# Patient Record
Sex: Female | Born: 1945
Health system: Southern US, Community
[De-identification: ages and names within clinical notes are randomized; demographics above are authoritative.]

## PROBLEM LIST (undated history)

## (undated) DIAGNOSIS — G5760 Lesion of plantar nerve, unspecified lower limb: Secondary | ICD-10-CM

## (undated) HISTORY — DX: Lesion of plantar nerve, unspecified lower limb: G57.60

## (undated) HISTORY — PX: ABDOMINAL HYSTERECTOMY: SHX81

---

## 1999-02-07 ENCOUNTER — Other Ambulatory Visit: Admission: RE | Admit: 1999-02-07 | Discharge: 1999-02-07 | Payer: Self-pay | Admitting: Nurse Practitioner

## 2001-01-11 ENCOUNTER — Other Ambulatory Visit: Admission: RE | Admit: 2001-01-11 | Discharge: 2001-01-11 | Payer: Self-pay | Admitting: Obstetrics and Gynecology

## 2001-09-16 ENCOUNTER — Other Ambulatory Visit: Admission: RE | Admit: 2001-09-16 | Discharge: 2001-09-16 | Payer: Self-pay | Admitting: Internal Medicine

## 2001-09-21 ENCOUNTER — Encounter: Payer: Self-pay | Admitting: Internal Medicine

## 2001-09-21 ENCOUNTER — Ambulatory Visit (HOSPITAL_COMMUNITY): Admission: RE | Admit: 2001-09-21 | Discharge: 2001-09-21 | Payer: Self-pay | Admitting: Internal Medicine

## 2002-11-20 ENCOUNTER — Other Ambulatory Visit: Admission: RE | Admit: 2002-11-20 | Discharge: 2002-11-20 | Payer: Self-pay | Admitting: Obstetrics and Gynecology

## 2003-12-10 ENCOUNTER — Encounter: Payer: Self-pay | Admitting: Gastroenterology

## 2006-02-15 ENCOUNTER — Ambulatory Visit: Payer: Self-pay | Admitting: Internal Medicine

## 2007-01-07 ENCOUNTER — Ambulatory Visit: Payer: Self-pay | Admitting: Internal Medicine

## 2007-01-18 ENCOUNTER — Ambulatory Visit: Payer: Self-pay | Admitting: Internal Medicine

## 2007-03-08 ENCOUNTER — Ambulatory Visit: Payer: Self-pay | Admitting: Internal Medicine

## 2007-03-10 LAB — CONVERTED CEMR LAB
ALT: 78 units/L — ABNORMAL HIGH (ref 0–40)
AST: 44 units/L — ABNORMAL HIGH (ref 0–37)
Albumin: 3.5 g/dL (ref 3.5–5.2)
Alkaline Phosphatase: 74 units/L (ref 39–117)
BUN: 12 mg/dL (ref 6–23)
Basophils Absolute: 0 10*3/uL (ref 0.0–0.1)
Bilirubin Urine: NEGATIVE
Calcium: 9.1 mg/dL (ref 8.4–10.5)
Chloride: 102 meq/L (ref 96–112)
Creatinine, Ser: 0.7 mg/dL (ref 0.4–1.2)
Eosinophils Absolute: 0 10*3/uL (ref 0.0–0.6)
FSH: 152.8 milliintl units/mL
HCT: 41.2 % (ref 36.0–46.0)
Hemoglobin, Urine: NEGATIVE
Ketones, ur: NEGATIVE mg/dL
MCHC: 35.2 g/dL (ref 30.0–36.0)
MCV: 91.3 fL (ref 78.0–100.0)
Neutro Abs: 3.8 10*3/uL (ref 1.4–7.7)
Neutrophils Relative %: 53.1 % (ref 43.0–77.0)
Platelets: 258 10*3/uL (ref 150–400)
RBC: 4.51 M/uL (ref 3.87–5.11)
Specific Gravity, Urine: <=1.005 (ref 1.000–1.03)
Total Bilirubin: 0.8 mg/dL (ref 0.3–1.2)
Total Protein, Urine: NEGATIVE mg/dL
Urine Glucose: NEGATIVE mg/dL
Urobilinogen, UA: 0.2 (ref 0.0–1.0)
pH: 6.5 (ref 5.0–8.0)

## 2007-05-12 ENCOUNTER — Ambulatory Visit: Payer: Self-pay | Admitting: Internal Medicine

## 2007-05-17 ENCOUNTER — Ambulatory Visit: Payer: Self-pay | Admitting: Gastroenterology

## 2007-07-18 ENCOUNTER — Other Ambulatory Visit: Admission: RE | Admit: 2007-07-18 | Discharge: 2007-07-18 | Payer: Self-pay | Admitting: Obstetrics and Gynecology

## 2007-10-11 ENCOUNTER — Encounter: Payer: Self-pay | Admitting: Internal Medicine

## 2007-10-11 DIAGNOSIS — F329 Major depressive disorder, single episode, unspecified: Secondary | ICD-10-CM

## 2007-10-11 DIAGNOSIS — J309 Allergic rhinitis, unspecified: Secondary | ICD-10-CM

## 2007-11-15 ENCOUNTER — Ambulatory Visit: Payer: Self-pay | Admitting: Internal Medicine

## 2007-11-15 DIAGNOSIS — F988 Other specified behavioral and emotional disorders with onset usually occurring in childhood and adolescence: Secondary | ICD-10-CM | POA: Insufficient documentation

## 2007-11-15 DIAGNOSIS — R945 Abnormal results of liver function studies: Secondary | ICD-10-CM

## 2008-01-27 ENCOUNTER — Ambulatory Visit: Payer: Self-pay | Admitting: Internal Medicine

## 2008-01-27 DIAGNOSIS — R071 Chest pain on breathing: Secondary | ICD-10-CM | POA: Insufficient documentation

## 2008-01-30 LAB — CONVERTED CEMR LAB
ALT: 51 units/L — ABNORMAL HIGH (ref 0–35)
AST: 42 units/L — ABNORMAL HIGH (ref 0–37)
Basophils Relative: 0.6 % (ref 0.0–1.0)
Bilirubin, Direct: 0.2 mg/dL (ref 0.0–0.3)
CO2: 30 meq/L (ref 19–32)
Calcium: 9.6 mg/dL (ref 8.4–10.5)
Chloride: 106 meq/L (ref 96–112)
Eosinophils Relative: 0.5 % (ref 0.0–5.0)
GFR calc Af Amer: 109 mL/min
Glucose, Bld: 99 mg/dL (ref 70–99)
Leukocytes, UA: NEGATIVE
Lymphocytes Relative: 28.8 % (ref 12.0–46.0)
Neutro Abs: 4.7 10*3/uL (ref 1.4–7.7)
Nitrite: NEGATIVE
Platelets: 252 10*3/uL (ref 150–400)
Specific Gravity, Urine: <=1.005 (ref 1.000–1.03)
Total Bilirubin: 0.9 mg/dL (ref 0.3–1.2)
Total Protein, Urine: NEGATIVE mg/dL
Total Protein: 7.2 g/dL (ref 6.0–8.3)
Urine Glucose: NEGATIVE mg/dL
Urobilinogen, UA: 0.2 (ref 0.0–1.0)
WBC: 7.5 10*3/uL (ref 4.5–10.5)

## 2008-05-24 ENCOUNTER — Telehealth: Payer: Self-pay | Admitting: Internal Medicine

## 2008-05-25 ENCOUNTER — Ambulatory Visit: Payer: Self-pay | Admitting: Internal Medicine

## 2008-05-25 DIAGNOSIS — H5310 Unspecified subjective visual disturbances: Secondary | ICD-10-CM | POA: Insufficient documentation

## 2008-06-13 ENCOUNTER — Telehealth: Payer: Self-pay | Admitting: Internal Medicine

## 2008-06-14 ENCOUNTER — Ambulatory Visit: Payer: Self-pay | Admitting: Internal Medicine

## 2008-06-14 DIAGNOSIS — K921 Melena: Secondary | ICD-10-CM

## 2008-06-14 DIAGNOSIS — R634 Abnormal weight loss: Secondary | ICD-10-CM | POA: Insufficient documentation

## 2008-06-14 LAB — CONVERTED CEMR LAB
AST: 25 units/L (ref 0–37)
Albumin: 3.6 g/dL (ref 3.5–5.2)
BUN: 16 mg/dL (ref 6–23)
Basophils Relative: 1.1 % — ABNORMAL HIGH (ref 0.0–1.0)
CEA: 1.8 ng/mL (ref 0.0–5.0)
Chloride: 106 meq/L (ref 96–112)
Creatinine, Ser: 0.9 mg/dL (ref 0.4–1.2)
Eosinophils Absolute: 0.1 10*3/uL (ref 0.0–0.7)
Eosinophils Relative: 1.1 % (ref 0.0–5.0)
GFR calc non Af Amer: 67 mL/min
Glucose, Bld: 95 mg/dL (ref 70–99)
HCT: 39 % (ref 36.0–46.0)
Lymphocytes Relative: 45.6 % (ref 12.0–46.0)
MCV: 94.4 fL (ref 78.0–100.0)
Monocytes Absolute: 0.5 10*3/uL (ref 0.1–1.0)
Monocytes Relative: 10.4 % (ref 3.0–12.0)
Neutrophils Relative %: 41.8 % — ABNORMAL LOW (ref 43.0–77.0)
Neutrophils Relative %: 41.8 % — ABNORMAL LOW (ref 43.0–77.0)
Platelets: 231 10*3/uL (ref 150–400)
RBC: 4.13 M/uL (ref 3.87–5.11)
RDW: 12.2 % (ref 11.5–14.6)
Total Protein: 6.5 g/dL (ref 6.0–8.3)
WBC: 5.2 10*3/uL (ref 4.5–10.5)

## 2008-06-15 ENCOUNTER — Encounter: Payer: Self-pay | Admitting: Internal Medicine

## 2008-06-21 DIAGNOSIS — Z860100 Personal history of colon polyps, unspecified: Secondary | ICD-10-CM | POA: Insufficient documentation

## 2008-06-21 DIAGNOSIS — K5730 Diverticulosis of large intestine without perforation or abscess without bleeding: Secondary | ICD-10-CM | POA: Insufficient documentation

## 2008-06-22 ENCOUNTER — Ambulatory Visit: Payer: Self-pay | Admitting: Gastroenterology

## 2008-06-22 DIAGNOSIS — R142 Eructation: Secondary | ICD-10-CM

## 2008-06-22 DIAGNOSIS — K625 Hemorrhage of anus and rectum: Secondary | ICD-10-CM | POA: Insufficient documentation

## 2008-06-22 DIAGNOSIS — R143 Flatulence: Secondary | ICD-10-CM

## 2008-06-22 DIAGNOSIS — R141 Gas pain: Secondary | ICD-10-CM | POA: Insufficient documentation

## 2008-06-22 LAB — CONVERTED CEMR LAB
Ferritin: 104.4 ng/mL (ref 10.0–291.0)
Folate: 13.1 ng/mL
Lipase: 30 units/L (ref 11.0–59.0)
Tissue Transglutaminase Ab, IgA: 0.4 units (ref <7)
Transferrin: 244.3 mg/dL (ref >=212.0)
Vitamin B-12: 501 pg/mL (ref 211–911)

## 2008-06-26 ENCOUNTER — Encounter: Payer: Self-pay | Admitting: Gastroenterology

## 2008-07-02 ENCOUNTER — Ambulatory Visit: Payer: Self-pay | Admitting: Gastroenterology

## 2008-07-02 ENCOUNTER — Encounter: Payer: Self-pay | Admitting: Gastroenterology

## 2008-07-04 ENCOUNTER — Encounter: Payer: Self-pay | Admitting: Gastroenterology

## 2008-07-13 ENCOUNTER — Telehealth (INDEPENDENT_AMBULATORY_CARE_PROVIDER_SITE_OTHER): Payer: Self-pay | Admitting: *Deleted

## 2008-07-13 ENCOUNTER — Encounter (INDEPENDENT_AMBULATORY_CARE_PROVIDER_SITE_OTHER): Payer: Self-pay | Admitting: *Deleted

## 2008-07-18 ENCOUNTER — Other Ambulatory Visit: Admission: RE | Admit: 2008-07-18 | Discharge: 2008-07-18 | Payer: Self-pay | Admitting: Obstetrics and Gynecology

## 2008-09-04 ENCOUNTER — Telehealth: Payer: Self-pay | Admitting: Internal Medicine

## 2009-05-22 ENCOUNTER — Ambulatory Visit: Payer: Self-pay | Admitting: Internal Medicine

## 2009-05-22 DIAGNOSIS — J45909 Unspecified asthma, uncomplicated: Secondary | ICD-10-CM | POA: Insufficient documentation

## 2009-05-22 DIAGNOSIS — J209 Acute bronchitis, unspecified: Secondary | ICD-10-CM

## 2009-10-06 ENCOUNTER — Emergency Department (HOSPITAL_COMMUNITY): Admission: EM | Admit: 2009-10-06 | Discharge: 2009-10-06 | Payer: Self-pay | Admitting: Emergency Medicine

## 2009-12-18 ENCOUNTER — Telehealth: Payer: Self-pay | Admitting: Internal Medicine

## 2009-12-18 ENCOUNTER — Ambulatory Visit: Payer: Self-pay | Admitting: Internal Medicine

## 2009-12-18 DIAGNOSIS — R5381 Other malaise: Secondary | ICD-10-CM

## 2009-12-18 DIAGNOSIS — M549 Dorsalgia, unspecified: Secondary | ICD-10-CM

## 2009-12-18 DIAGNOSIS — R5383 Other fatigue: Secondary | ICD-10-CM

## 2009-12-18 LAB — CONVERTED CEMR LAB: Vit D, 25-Hydroxy: 45 ng/mL (ref 30–89)

## 2009-12-19 LAB — CONVERTED CEMR LAB
Albumin: 3.6 g/dL (ref 3.5–5.2)
Alkaline Phosphatase: 83 units/L (ref 39–117)
BUN: 14 mg/dL (ref 6–23)
Basophils Absolute: 0 10*3/uL (ref 0.0–0.1)
Basophils Relative: 0.8 % (ref 0.0–3.0)
Bilirubin Urine: NEGATIVE
Bilirubin, Direct: 0.1 mg/dL (ref 0.0–0.3)
CO2: 26 meq/L (ref 19–32)
Calcium: 9.3 mg/dL (ref 8.4–10.5)
Chloride: 106 meq/L (ref 96–112)
Creatinine, Ser: 0.7 mg/dL (ref 0.4–1.2)
Direct LDL: 153.2 mg/dL
Eosinophils Absolute: 0.1 10*3/uL (ref 0.0–0.7)
Folate: 15.4 ng/mL
Glucose, Bld: 87 mg/dL (ref 70–99)
HDL: 66.5 mg/dL (ref >39.00)
Iron: 68 ug/dL (ref 42–145)
Ketones, ur: NEGATIVE mg/dL
Lymphocytes Relative: 40.9 % (ref 12.0–46.0)
MCHC: 33.7 g/dL (ref 30.0–36.0)
MCV: 96.3 fL (ref 78.0–100.0)
Monocytes Absolute: 0.5 10*3/uL (ref 0.1–1.0)
Neutrophils Relative %: 46.5 % (ref 43.0–77.0)
RDW: 12.2 % (ref 11.5–14.6)
Sed Rate: 46 mm/hr — ABNORMAL HIGH (ref 0–22)
Specific Gravity, Urine: <=1.005 (ref 1.000–1.030)
Total Protein: 7.3 g/dL (ref 6.0–8.3)
Transferrin: 223.9 mg/dL (ref 212.0–360.0)
Urine Glucose: NEGATIVE mg/dL
Urobilinogen, UA: 0.2 (ref 0.0–1.0)
VLDL: 14.2 mg/dL (ref 0.0–40.0)

## 2009-12-25 ENCOUNTER — Telehealth: Payer: Self-pay | Admitting: Internal Medicine

## 2009-12-30 ENCOUNTER — Ambulatory Visit: Payer: Self-pay | Admitting: Internal Medicine

## 2009-12-30 DIAGNOSIS — R079 Chest pain, unspecified: Secondary | ICD-10-CM

## 2009-12-30 DIAGNOSIS — M25519 Pain in unspecified shoulder: Secondary | ICD-10-CM

## 2009-12-30 DIAGNOSIS — E785 Hyperlipidemia, unspecified: Secondary | ICD-10-CM

## 2009-12-30 DIAGNOSIS — M542 Cervicalgia: Secondary | ICD-10-CM

## 2010-01-14 ENCOUNTER — Telehealth: Payer: Self-pay | Admitting: Internal Medicine

## 2010-01-26 ENCOUNTER — Ambulatory Visit (HOSPITAL_COMMUNITY): Admission: RE | Admit: 2010-01-26 | Discharge: 2010-01-26 | Payer: Self-pay | Admitting: Family Medicine

## 2010-02-07 ENCOUNTER — Telehealth: Payer: Self-pay | Admitting: Internal Medicine

## 2010-04-22 IMAGING — CT CT HEAD W/O CM
1 series · 16 of 28 positions shown, 20 images · non-contrast
Comparison: None

CLINICAL DATA: Head injury, laceration to frontal prominence

CT HEAD WITHOUT CONTRAST
TECHNIQUE: Contiguous axial images were obtained from the base of
the skull through the vertex without contrast.

[Series 2: brain · axial · 0.47mm/px · z∈[+146,+283]mm · 16 of 28 slices shown, 20 images]
[im 2/28  brain]
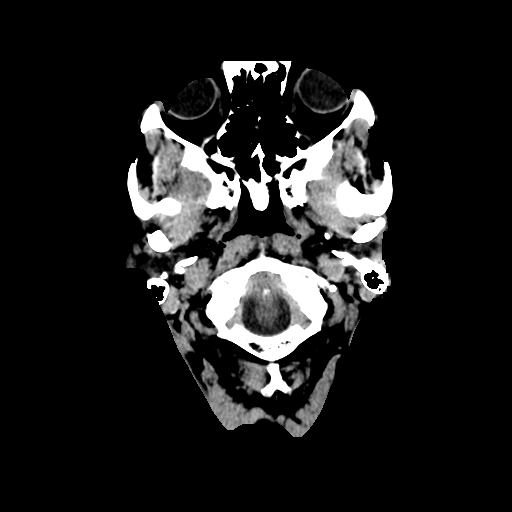
[im 2/28  bone]
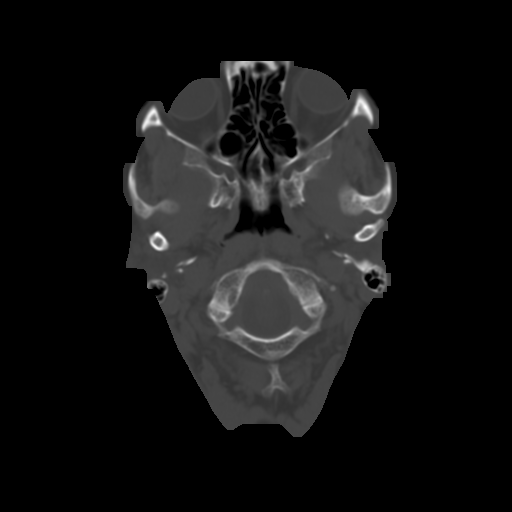
[im 4/28  brain]
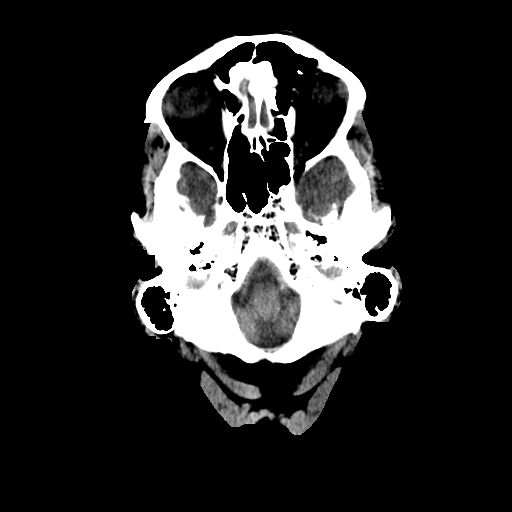
[im 6/28  brain]
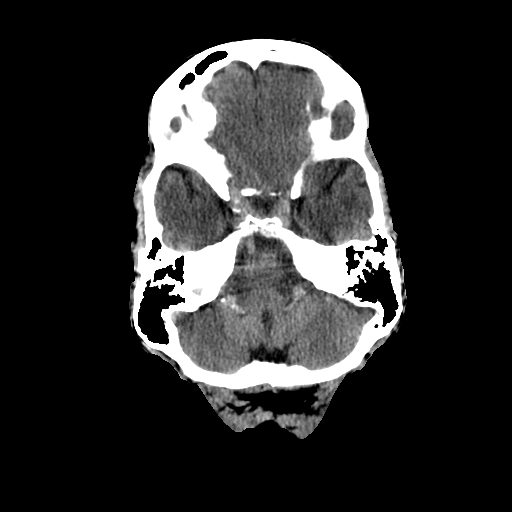
[im 7/28  brain]
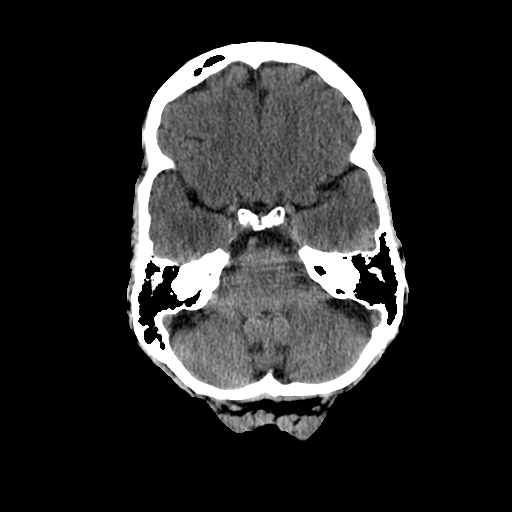
[im 9/28  brain]
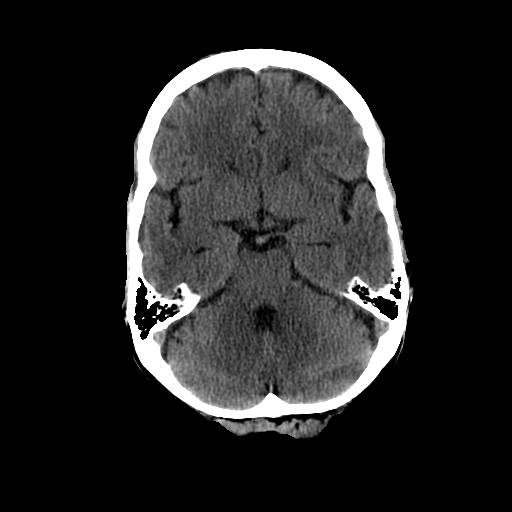
[im 9/28  bone]
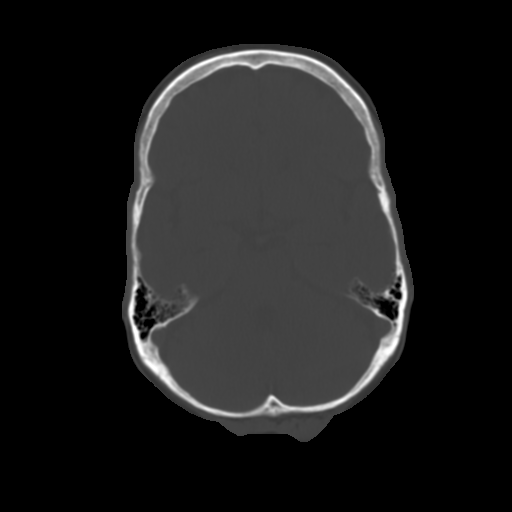
[im 10/28  brain]
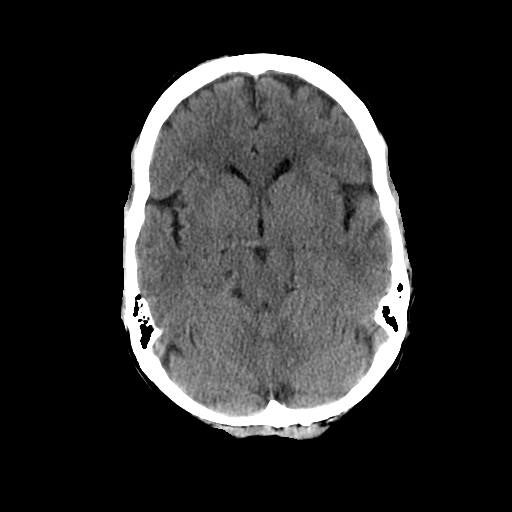
[im 12/28  brain]
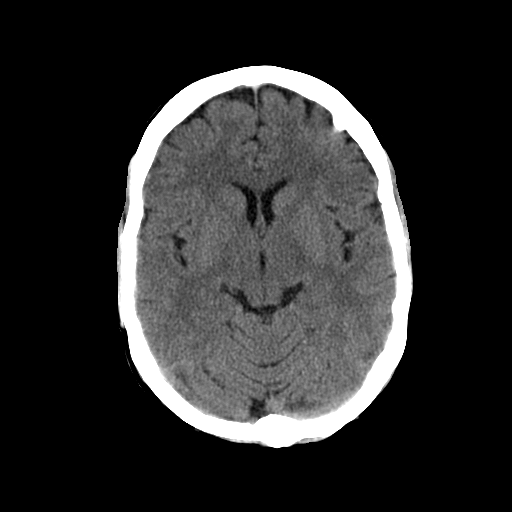
[im 14/28  brain]
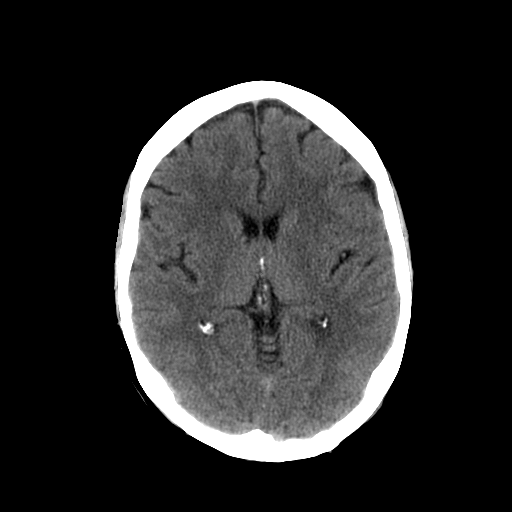
[im 15/28  brain]
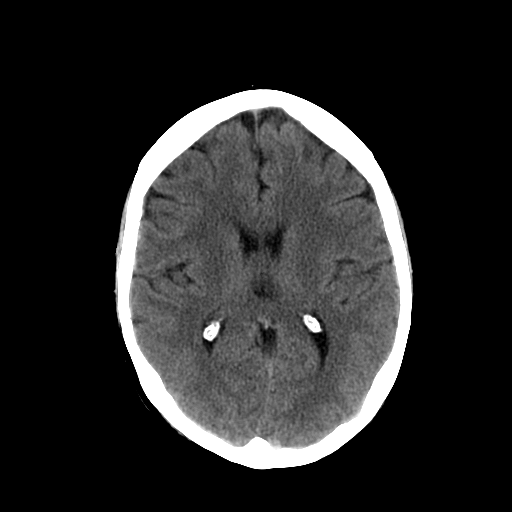
[im 15/28  bone]
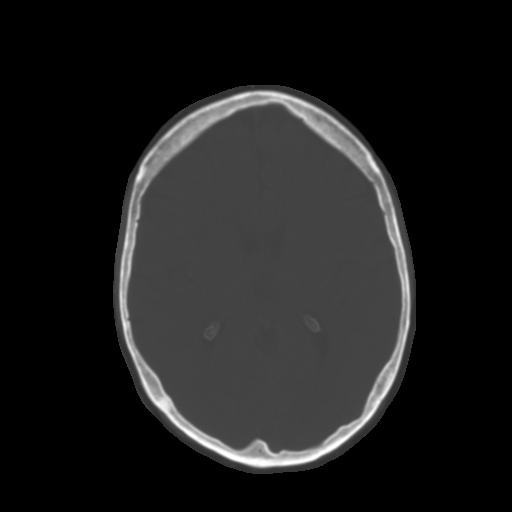
[im 17/28  brain]
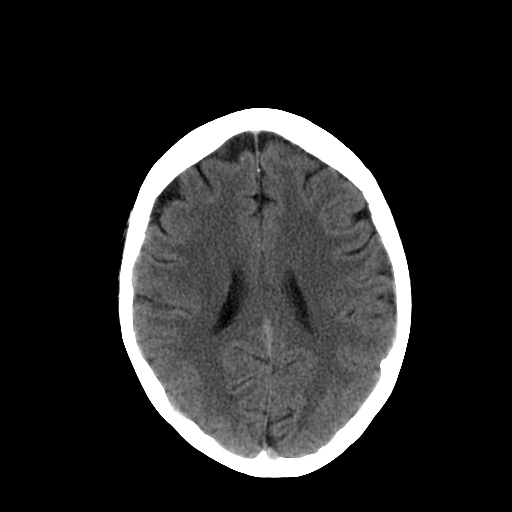
[im 19/28  brain]
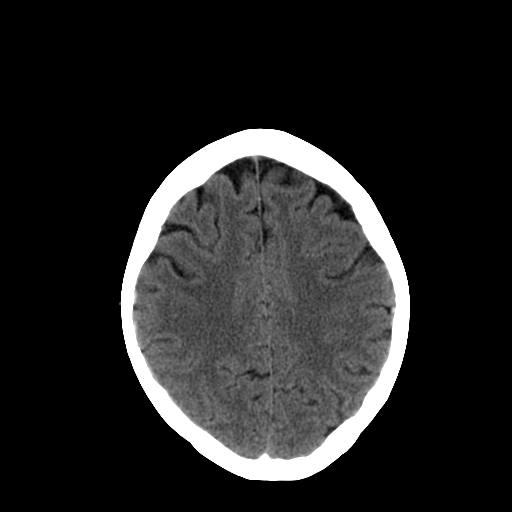
[im 20/28  brain]
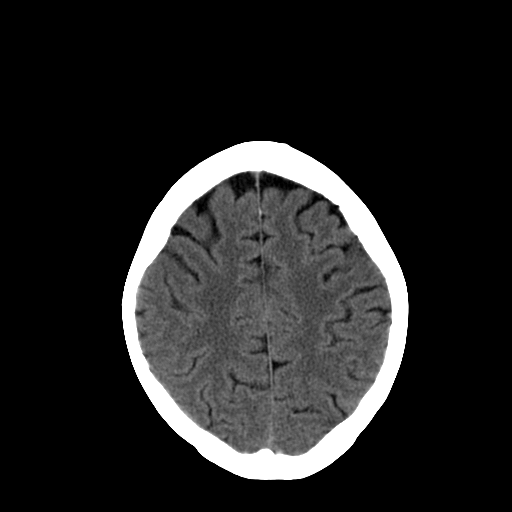
[im 22/28  brain]
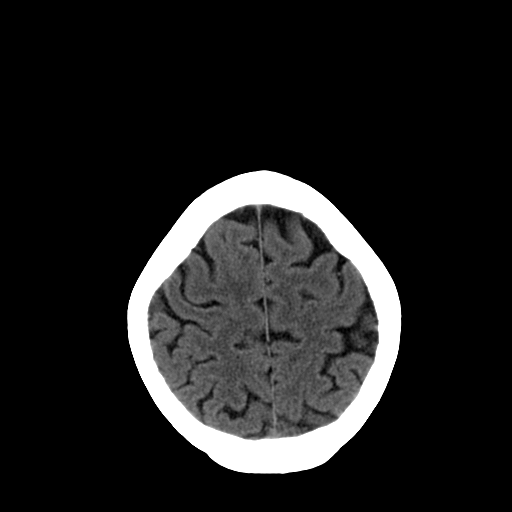
[im 22/28  bone]
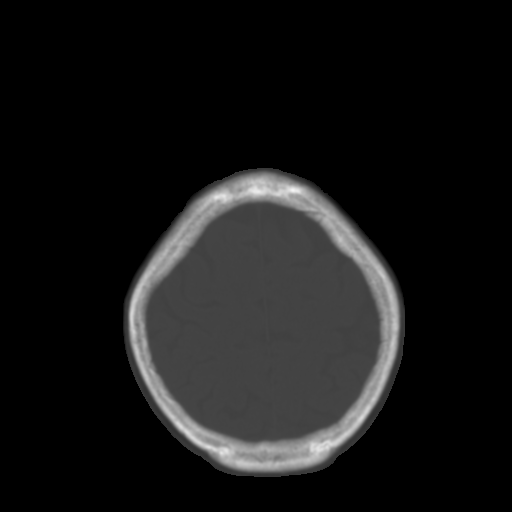
[im 23/28  brain]
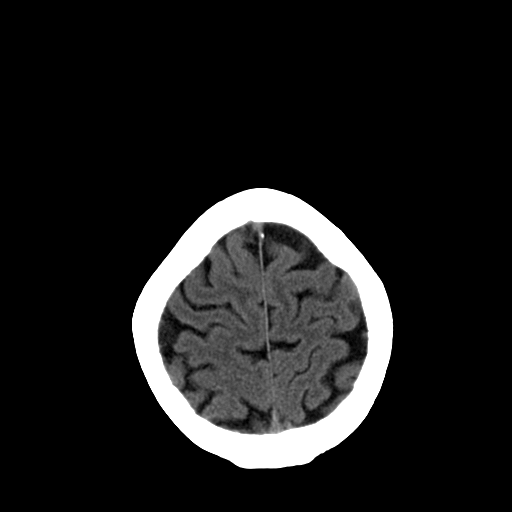
[im 25/28  brain]
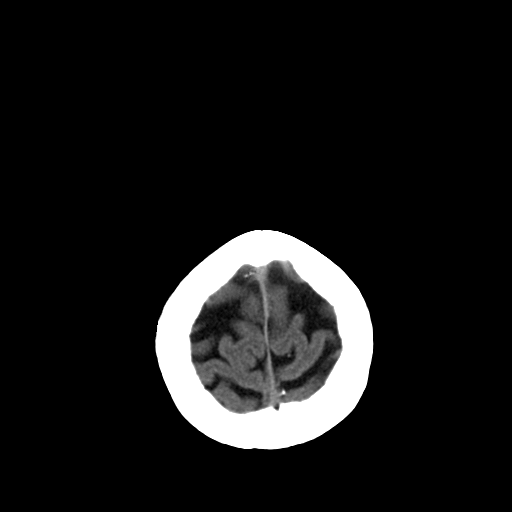
[im 27/28  brain]
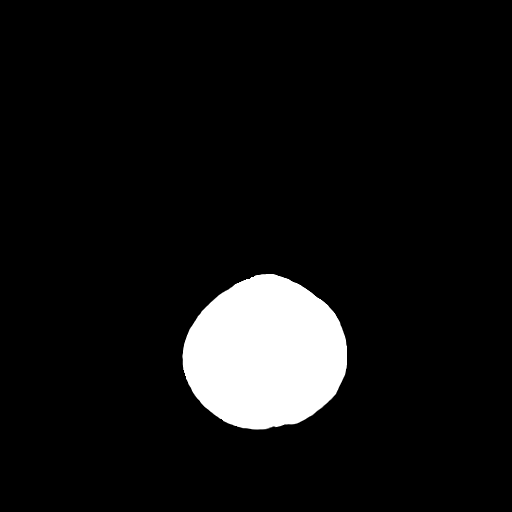

[16 of 28 positions shown; findings below may reference images not displayed]

FINDINGS: No acute intracranial hemorrhage.  No focal mass lesion.
No CT evidence of acute infarction.  No midline shift or mass
effect.  No hydrocephalus.

Review bone windows demonstrates no evidence of skull fracture.
Paranasal sinuses mastoid air cells are clear.
IMPRESSION: No evidence of intracranial trauma.

## 2010-05-22 ENCOUNTER — Ambulatory Visit: Payer: Self-pay | Admitting: Internal Medicine

## 2010-05-22 DIAGNOSIS — R062 Wheezing: Secondary | ICD-10-CM

## 2010-05-23 ENCOUNTER — Telehealth: Payer: Self-pay | Admitting: Internal Medicine

## 2010-05-27 ENCOUNTER — Telehealth: Payer: Self-pay | Admitting: Internal Medicine

## 2011-01-27 NOTE — Progress Notes (Signed)
Summary: RX too Expensive  Phone Note From Pharmacy   Caller: [facility] Summary of Call: Pharm called, pt has no prescription coverage and levaquin w/coupon is 200$. Pt wants to wait at pharm, advised I would get note to MD but they would have to wait for reply. Will MD change rx to cheaper alternative? Initial call taken by: Lamar Sprinkles, CMA,  May 23, 2010 10:08 AM  Follow-up for Phone Call        ok to change to cephlexin course - done escript Follow-up by: Corwin Levins MD,  May 23, 2010 12:06 PM    New/Updated Medications: CEPHALEXIN 500 MG CAPS (CEPHALEXIN) 1po three times a day Prescriptions: CEPHALEXIN 500 MG CAPS (CEPHALEXIN) 1po three times a day  #30 x 0   Entered by:   Corwin Levins MD   Authorized by:   Tresa Garter MD   Signed by:   Corwin Levins MD on 05/23/2010   Method used:   Electronically to        [facility]. [phone]* (retail)       [address]       [place], [place]  [address]       Ph: [phone]       Fax: [phone]   RxID:   [id number]

## 2011-01-27 NOTE — Assessment & Plan Note (Signed)
Summary: COUGH  STC   Vital Signs:  Patient profile:   65 year old female Height:      68 inches Weight:      150.50 pounds BMI:     22.97 O2 Sat:      94 % on Room air Temp:     97.2 degrees F oral Pulse rate:   74 / minute BP sitting:   100 / 60  (left arm) Cuff size:   regular  Vitals Entered ByZella Ball Ewing (May 22, 2010 4:10 PM)  O2 Flow:  Room air  CC: congestion, cough/Re   Primary Care Provider:  Trinna Post Plotnikov,MD  CC:  congestion and cough/Re.  History of Present Illness: here with ongoing allergy nasal symtpoms, mild for wks, but then more acute onset URI symtpoms with sinus congestion, off colored d/c, low grade fever, mild ST and slight non prod cough, but then today with onset midl wheezing, sob/doe, but .Pt denies CP,  orthopnea, pnd, worsening LE edema, palps, dizziness or syncope .  Feeling general malaise, weakness and fatigue but no increased depressive symtpoms.  nyquil and mucinex dm no help.    Problems Prior to Update: 1)  Wheezing  (ICD-786.07) 2)  Bronchitis-acute  (ICD-466.0) 3)  Hyperlipidemia  (ICD-272.4) 4)  Shoulder Pain, Bilateral  (ICD-719.41) 5)  Neck Pain  (ICD-723.1) 6)  Chest Pain  (ICD-786.50) 7)  Fatigue  (ICD-780.79) 8)  Back Pain  (ICD-724.5) 9)  Bronchitis, Acute With Mild Bronchospasm  (ICD-466.0) 10)  Rectal Bleeding  (ICD-569.3) 11)  Abdominal Bloating  (ICD-787.3) 12)  Colonic Polyps, Hyperplastic, Hx of  (ICD-V12.72) 13)  Diverticulosis, Colon  (ICD-562.10) 14)  Hematochezia  (ICD-578.1) 15)  Loss of Weight  (ICD-783.21) 16)  Visual Changes  (ICD-368.10) 17)  Chest Wall Pain  (ICD-786.52) 18)  Add  (ICD-314.00) 19)  Abnormal Liver Function Tests  (ICD-794.8) 20)  Depression  (ICD-311) 21)  Allergic Rhinitis  (ICD-477.9) 22)  Family History of Cad Female 1st Degree Relative <50  (ICD-V17.3)  Medications Prior to Update: 1)  Hydrocodone-Acetaminophen 5-325 Mg Tabs (Hydrocodone-Acetaminophen) .[name].. 1po Once Daily As  Needed 2)  Adderall 5 Mg  Tabs (Amphetamine-Dextroamphetamine) .[name].. 1 By Mouth Q Am As Needed  - To Fill Dec 18, 2009 3)  Vitamin-B Complex   Tabs (B Complex Vitamins) .... Once Daily 4)  Vitamin D3 1000 Unit  Tabs (Cholecalciferol) .... One By Mouth Once Daily 5)  Fish Oil 500 Mg  Caps (Omega-3 Fatty Acids) .... Take 1 Tablet By Mouth Once A Day 6)  Ciprofloxacin Hcl 500 Mg Tabs (Ciprofloxacin Hcl) .[name].. 1po Two Times A Day 7)  Klor-Con 20 Meq Pack (Potassium Chloride) .[name].. 1po Once Daily  Current Medications (verified): 1)  Hydrocodone-Acetaminophen 5-325 Mg Tabs (Hydrocodone-Acetaminophen) .[name].. 1po Once Daily As Needed 2)  Adderall 5 Mg  Tabs (Amphetamine-Dextroamphetamine) .[name].. 1 By Mouth Q Am As Needed  - To Fill Dec 18, 2009 3)  Vitamin-B Complex   Tabs (B Complex Vitamins) .... Once Daily 4)  Vitamin D3 1000 Unit  Tabs (Cholecalciferol) .... One By Mouth Once Daily 5)  Fish Oil 500 Mg  Caps (Omega-3 Fatty Acids) .... Take 1 Tablet By Mouth Once A Day 6)  Levaquin 500 Mg Tabs (Levofloxacin) .[name].. 1po Once Daily 7)  Klor-Con 20 Meq Pack (Potassium Chloride) .[name].. 1po Once Daily 8)  Hydrocodone-Homatropine 5-1.5 Mg/81ml Syrp (Hydrocodone-Homatropine) .[name].. 1 Tsp By Mouth Q 6 Hrs As Needed Cough 9)  Prednisone 10 Mg Tabs (Prednisone) .Marland KitchenMarland KitchenMarland Kitchen  3po Qd For 3days, Then 2po Qd For 3days, Then 1po Qd For 3days, Then Stop  Allergies (verified): 1)  ! Sodium Sulfate (Sodium Sulfate)  Past History:  Past Medical History: Last updated: 12/30/2009 Allergic rhinitis Depression dyslipidemia Elev. LFT.Marland Kitchenresolved on followup testing. ADD HAs Hyperlipidemia  Past Surgical History: Last updated: 11/15/2007 Hemorrhoidectomy Appendectomy Jaw surgery for TMJ  Social History: Last updated: 06/22/2008 Single Former Smoker Alcohol use-yes Illicit Drug Use - no Patient does not get regular exercise.   Risk Factors: Exercise: no (06/22/2008)  Risk Factors: Smoking Status: quit  (11/15/2007)  Review of Systems       all otherwise negative per pt -    Physical Exam  General:  alert and well-developed.  , mild ill  Head:  normocephalic and atraumatic.   Eyes:  vision grossly intact, pupils equal, and pupils round.   Ears:  bilat tm's red , left more than right Nose:  nasal dischargemucosal pallor and mucosal edema.   Mouth:  pharyngeal erythema and fair dentition.   Neck:  supple and no masses.   Lungs:  normal respiratory effort, R decreased breath sounds, R wheezes, L decreased breath sounds, and L wheezes.   Heart:  normal rate and regular rhythm.   Extremities:  no edema, no erythema    Impression & Recommendations:  Problem # 1:  BRONCHITIS-ACUTE (ICD-466.0)  Her updated medication list for this problem includes:    Levaquin 500 Mg Tabs (Levofloxacin) .[name]... 1po once daily    Hydrocodone-homatropine 5-1.5 Mg/67ml Syrp (Hydrocodone-homatropine) .[name]... 1 tsp by mouth q 6 hrs as needed cough treat as above, f/u any worsening signs or symptoms   Problem # 2:  WHEEZING (ICD-786.07)  for depo shot today, and pred pack, f/u any worsening symptoms  Orders: Depo- Medrol 40mg  (J1030) Depo- Medrol 80mg  (J1040) Admin of Therapeutic Inj  intramuscular or subcutaneous (16109)  Problem # 3:  DEPRESSION (ICD-311) stable overall by hx and exam, ok to continue meds/tx as is -  no med needed at this time  Problem # 4:  ALLERGIC RHINITIS (ICD-477.9) for OTC meds such as claritin as needed   Complete Medication List: 1)  Hydrocodone-acetaminophen 5-325 Mg Tabs (Hydrocodone-acetaminophen) .[name].. 1po once daily as needed 2)  Adderall 5 Mg Tabs (Amphetamine-dextroamphetamine) .[name].. 1 by mouth q am as needed  - to fill Dec 18, 2009 3)  Vitamin-b Complex Tabs (B complex vitamins) .... Once daily 4)  Vitamin D3 1000 Unit Tabs (Cholecalciferol) .... One by mouth once daily 5)  Fish Oil 500 Mg Caps (Omega-3 fatty acids) .... Take 1 tablet by mouth once a day 6)  Levaquin  500 Mg Tabs (Levofloxacin) .[name].. 1po once daily 7)  Klor-con 20 Meq Pack (Potassium chloride) .[name].. 1po once daily 8)  Hydrocodone-homatropine 5-1.5 Mg/24ml Syrp (Hydrocodone-homatropine) .[name].. 1 tsp by mouth q 6 hrs as needed cough 9)  Prednisone 10 Mg Tabs (Prednisone) .... 3po qd for 3days, then 2po qd for 3days, then 1po qd for 3days, then stop  Patient Instructions: 1)  you had the steroid shot today 2)  Please take all new medications as prescribed 3)  Continue all previous medications as before this visit  4)  Please schedule a follow-up appointment in 6 months or sooner if needed with primary MD Prescriptions: PREDNISONE 10 MG TABS (PREDNISONE) 3po qd for 3days, then 2po qd for 3days, then 1po qd for 3days, then stop  #18 x 0   Entered and Authorized by:   Corwin Levins  MD   Signed by:   Corwin Levins MD on 05/22/2010   Method used:   Print then Give to Patient   RxID:   [id number] HYDROCODONE-HOMATROPINE 5-1.5 MG/5ML SYRP (HYDROCODONE-HOMATROPINE) 1 tsp by mouth q 6 hrs as needed cough  #6oz x 1   Entered and Authorized by:   Corwin Levins MD   Signed by:   Corwin Levins MD on 05/22/2010   Method used:   Print then Give to Patient   RxID:   [id number] LEVAQUIN 500 MG TABS (LEVOFLOXACIN) 1po once daily  #10 x 0   Entered and Authorized by:   Corwin Levins MD   Signed by:   Corwin Levins MD on 05/22/2010   Method used:   Print then Give to Patient   RxID:   [id number]    Medication Administration  Injection # 1:    Medication: Depo- Medrol 40mg     Diagnosis: WHEEZING (ICD-786.07)    Route: IM    Site: RUOQ gluteus    Exp Date: 03/2013    Lot #: [id number]    Mfr: [employer]    Given byZella Ball Ewing (May 22, 2010 4:41 PM)  Injection # 2:    Medication: Depo- Medrol 80mg     Diagnosis: WHEEZING (ICD-786.07)    Route: IM    Site: RUOQ gluteus    Exp Date: 03/2013    Lot #: [id number]    Mfr: [employer]    Given byZella Ball Ewing (May 22, 2010 4:41 PM)  Orders  Added: 1)  Depo- Medrol 40mg  [J1030] 2)  Depo- Medrol 80mg  [J1040] 3)  Admin of Therapeutic Inj  intramuscular or subcutaneous [96372] 4)  Est. Patient Level IV [01027]

## 2011-01-27 NOTE — Progress Notes (Signed)
Summary: Results  Phone Note Call from Patient Call back at Home Phone [phone]   Caller: Patient Call For: Dr Jonny Ruiz Summary of Call: Pt had xrays a couple wks ago and they are no longer on phone tree, pt never rec'd results of xrays. Initial call taken by: Verdell Face,  January 14, 2010 12:12 PM  Follow-up for Phone Call        pt informed Follow-up by: Margaret Pyle, CMA,  January 14, 2010 2:04 PM

## 2011-01-27 NOTE — Progress Notes (Signed)
Summary: pt request  Phone Note Call from Patient Call back at Home Phone [phone]   Caller: Patient Summary of Call: pt called requesting a letter stating "she is in good health with no communicable disease" .I called pt and she said that she is planning to move to another country and it needed in order for her to gain residency. Initial call taken by: Margaret Pyle, CMA,  February 07, 2010 3:15 PM  Follow-up for Phone Call        would she be willing to do a PPD?  please adminster if so, and then we can do the note Follow-up by: Corwin Levins MD,  February 07, 2010 4:29 PM  Additional Follow-up for Phone Call Additional follow up Details #1::        left message on machine informing pt of above and asked her to call back and let me know Additional Follow-up by: Margaret Pyle, CMA,  February 07, 2010 4:43 PM    Additional Follow-up for Phone Call Additional follow up Details #2::    pt informed and will call back to schedule Nurse/OV Follow-up by: Margaret Pyle, CMA,  February 10, 2010 3:48 PM

## 2011-01-27 NOTE — Assessment & Plan Note (Signed)
Summary: PT HOSP VISIT CHEST,NECK,AND BACK PAIN-LB   Vital Signs:  Patient profile:   65 year old female Height:      68 inches Weight:      174 pounds BMI:     26.55 O2 Sat:      99 % on Room air Temp:     97.8 degrees F oral Pulse rate:   79 / minute BP sitting:   110 / 80  (left arm) Cuff size:   regular  Vitals Entered ByZella Ball Ewing (December 30, 2009 11:16 AM)  O2 Flow:  Room air CC: followup ER symptons/RE   Primary Care Provider:  Trinna Post Plotnikov,MD  CC:  followup ER symptons/RE.  History of Present Illness: here with intermittent , sharp adn non, nonexertional and non pleuritic pains to the anterior chest, neck and shoulder, not assoc with sob, diaphoresis, n/v , palp's or syncope.  Last for 1 -2 days, constant, at least 3 episodes over the last 3 mo,  worse with stress and has been not taking her ADD meds regularly as well.  Pt denies wheezing, orthopnea, pnd, worsening LE edema, palps, dizziness or syncope .  Pt denies new neuro symptoms such as headache, facial or extremity weakness   No fever, sT, cough, rash or joint pains.  Seen in Er recently  -  w/u neg per pt and felt ok to go home.    Problems Prior to Update: 1)  Hyperlipidemia  (ICD-272.4) 2)  Shoulder Pain, Bilateral  (ICD-719.41) 3)  Neck Pain  (ICD-723.1) 4)  Chest Pain  (ICD-786.50) 5)  Fatigue  (ICD-780.79) 6)  Back Pain  (ICD-724.5) 7)  Bronchitis, Acute With Mild Bronchospasm  (ICD-466.0) 8)  Rectal Bleeding  (ICD-569.3) 9)  Abdominal Bloating  (ICD-787.3) 10)  Colonic Polyps, Hyperplastic, Hx of  (ICD-V12.72) 11)  Diverticulosis, Colon  (ICD-562.10) 12)  Hematochezia  (ICD-578.1) 13)  Loss of Weight  (ICD-783.21) 14)  Visual Changes  (ICD-368.10) 15)  Chest Wall Pain  (ICD-786.52) 16)  Add  (ICD-314.00) 17)  Abnormal Liver Function Tests  (ICD-794.8) 18)  Depression  (ICD-311) 19)  Allergic Rhinitis  (ICD-477.9) 20)  Family History of Cad Female 1st Degree Relative <50   (ICD-V17.3)  Medications Prior to Update: 1)  Hydrocodone-Acetaminophen 5-325 Mg Tabs (Hydrocodone-Acetaminophen) .[name].. 1po Once Daily As Needed 2)  Adderall 5 Mg  Tabs (Amphetamine-Dextroamphetamine) .[name].. 1 By Mouth Q Am As Needed  - To Fill Dec 18, 2009 3)  Vitamin-B Complex   Tabs (B Complex Vitamins) .... Once Daily 4)  Vitamin D3 1000 Unit  Tabs (Cholecalciferol) .... One By Mouth Once Daily 5)  Fish Oil 500 Mg  Caps (Omega-3 Fatty Acids) .... Take 1 Tablet By Mouth Once A Day 6)  Ciprofloxacin Hcl 500 Mg Tabs (Ciprofloxacin Hcl) .[name].. 1po Two Times A Day 7)  Klor-Con 20 Meq Pack (Potassium Chloride) .[name].. 1po Once Daily  Current Medications (verified): 1)  Hydrocodone-Acetaminophen 5-325 Mg Tabs (Hydrocodone-Acetaminophen) .[name].. 1po Once Daily As Needed 2)  Adderall 5 Mg  Tabs (Amphetamine-Dextroamphetamine) .[name].. 1 By Mouth Q Am As Needed  - To Fill Dec 18, 2009 3)  Vitamin-B Complex   Tabs (B Complex Vitamins) .... Once Daily 4)  Vitamin D3 1000 Unit  Tabs (Cholecalciferol) .... One By Mouth Once Daily 5)  Fish Oil 500 Mg  Caps (Omega-3 Fatty Acids) .... Take 1 Tablet By Mouth Once A Day 6)  Ciprofloxacin Hcl 500 Mg Tabs (Ciprofloxacin Hcl) .[name].. 1po Two Times A  Day 7)  Klor-Con 20 Meq Pack (Potassium Chloride) .[name].. 1po Once Daily  Allergies (verified): 1)  ! Sodium Sulfate (Sodium Sulfate)  Past History:  Past Surgical History: Last updated: 11/15/2007 Hemorrhoidectomy Appendectomy Jaw surgery for TMJ  Social History: Last updated: 06/22/2008 Single Former Smoker Alcohol use-yes Illicit Drug Use - no Patient does not get regular exercise.   Risk Factors: Exercise: no (06/22/2008)  Risk Factors: Smoking Status: quit (11/15/2007)  Past Medical History: Allergic rhinitis Depression dyslipidemia Elev. LFT.Marland Kitchenresolved on followup testing. ADD HAs Hyperlipidemia  Review of Systems       all otherwise negative per pt - 12 system review done  Physical  Exam  General:  alert and overweight-appearing.  , quite animated, pressured speech and apologetic for not taking her med Head:  normocephalic and atraumatic.   Eyes:  vision grossly intact, pupils equal, and pupils round.   Ears:  R ear normal and L ear normal.   Nose:  no external deformity and no nasal discharge.   Mouth:  no gingival abnormalities and pharynx pink and moist.   Neck:  supple, full ROM, and no masses.   Lungs:  normal respiratory effort and normal breath sounds.   Heart:  normal rate and regular rhythm.   Abdomen:  soft, non-tender, and normal bowel sounds.   Msk:  has some left upper sternal tenderness that reproduces her pain;  neck from with mild bilat paracerv tender - o/w FROM;  shoulders with mild bilat AC joint tender wtihout soft tissue swelling - o/w FROM, NT;  spine nontender throughout Extremities:  no edema, no erythema  Neurologic:  cranial nerves II-XII intact and strength normal in all extremities.  ; o/w not done in detail   Impression & Recommendations:  Problem # 1:  CHEST PAIN (ICD-786.50) atypical, suspect MSK but will check cxr to r/o other; decines ecg - was done in the ER per pt Orders: T-2 View CXR, Same Day (71020.5TC)  Problem # 2:  NECK PAIN (ICD-723.1)  Her updated medication list for this problem includes:    Hydrocodone-acetaminophen 5-325 Mg Tabs (Hydrocodone-acetaminophen) .[name]... 1po once daily as needed  Orders: T-Cervical Spine Comp 4 Views [phone]) exam benign, suspect DJD - will check c-spine films  Problem # 3:  SHOULDER PAIN, BILATERAL (ICD-719.41)  Her updated medication list for this problem includes:    Hydrocodone-acetaminophen 5-325 Mg Tabs (Hydrocodone-acetaminophen) .[name]... 1po once daily as needed  Orders: T-Shoulder Left Min 2 Views (73030TC) T-Shoulder Right (73030TC) also for shoulder films  - suspect DJD  Problem # 4:  HYPERLIPIDEMIA (ICD-272.4)  declines statin at this time, fully d/wpt - goal ldl less  than 100, to follow low chol diet  Labs Reviewed: SGOT: 19 (12/18/2009)   SGPT: 25 (12/18/2009)   HDL:66.50 (12/18/2009)  Chol:231 (12/18/2009)  Trig:71.0 (12/18/2009)  Complete Medication List: 1)  Hydrocodone-acetaminophen 5-325 Mg Tabs (Hydrocodone-acetaminophen) .[name].. 1po once daily as needed 2)  Adderall 5 Mg Tabs (Amphetamine-dextroamphetamine) .[name].. 1 by mouth q am as needed  - to fill Dec 18, 2009 3)  Vitamin-b Complex Tabs (B complex vitamins) .... Once daily 4)  Vitamin D3 1000 Unit Tabs (Cholecalciferol) .... One by mouth once daily 5)  Fish Oil 500 Mg Caps (Omega-3 fatty acids) .... Take 1 tablet by mouth once a day 6)  Ciprofloxacin Hcl 500 Mg Tabs (Ciprofloxacin hcl) .[name].. 1po two times a day 7)  Klor-con 20 Meq Pack (Potassium chloride) .[name].. 1po once daily  Patient Instructions: 1)  Please go  to Radiology in the basement level for your X-Rays today  2)  please follow low cholesterol diet 3)  Continue all previous medications as before this visit  - Ok to start the adderall as prescribed 4)  Please schedule a follow-up appointment as recommended at your last office visit

## 2011-01-27 NOTE — Progress Notes (Signed)
Summary: Switch to Dr. Jonny Ruiz  ---- Converted from flag ---- ---- 05/27/2010 2:37 PM, Hilarie Fredrickson wrote: Pt. informed.  Declined to set up an appt. with Dr. Posey Rea.  ---- 05/27/2010 12:09 PM, Corwin Levins MD wrote: sorry, unable to take new pt at this time  ---- 05/27/2010 10:18 AM, Hilarie Fredrickson wrote: PT WANTS TO SWITCH PCP TO DR Jonny Ruiz. ------------------------------

## 2011-04-02 LAB — POCT I-STAT, CHEM 8
Calcium, Ion: 1.08 mmol/L — ABNORMAL LOW (ref 1.12–1.32)
Chloride: 107 mEq/L (ref 96–112)
HCT: 37 % (ref 36.0–46.0)
Potassium: 3.6 mEq/L (ref 3.5–5.1)

## 2011-04-02 LAB — D-DIMER, QUANTITATIVE: D-Dimer, Quant: 0.36 ug/mL-FEU (ref 0.00–0.48)

## 2011-04-02 LAB — POCT CARDIAC MARKERS
CKMB, poc: <1 ng/mL — ABNORMAL LOW (ref 1.0–8.0)
Troponin i, poc: <0.05 ng/mL (ref 0.00–0.09)

## 2012-01-11 ENCOUNTER — Other Ambulatory Visit (INDEPENDENT_AMBULATORY_CARE_PROVIDER_SITE_OTHER): Payer: Medicare PPO

## 2012-01-11 ENCOUNTER — Ambulatory Visit (INDEPENDENT_AMBULATORY_CARE_PROVIDER_SITE_OTHER): Payer: Medicare PPO | Admitting: Internal Medicine

## 2012-01-11 ENCOUNTER — Encounter: Payer: Self-pay | Admitting: Internal Medicine

## 2012-01-11 ENCOUNTER — Other Ambulatory Visit: Payer: Self-pay | Admitting: Internal Medicine

## 2012-01-11 VITALS — BP 112/50 | HR 76 | Resp 16 | Ht 68.5 in | Wt 146.0 lb

## 2012-01-11 DIAGNOSIS — R300 Dysuria: Secondary | ICD-10-CM

## 2012-01-11 DIAGNOSIS — M549 Dorsalgia, unspecified: Secondary | ICD-10-CM

## 2012-01-11 DIAGNOSIS — F329 Major depressive disorder, single episode, unspecified: Secondary | ICD-10-CM

## 2012-01-11 DIAGNOSIS — R5383 Other fatigue: Secondary | ICD-10-CM

## 2012-01-11 DIAGNOSIS — F3289 Other specified depressive episodes: Secondary | ICD-10-CM

## 2012-01-11 DIAGNOSIS — R5381 Other malaise: Secondary | ICD-10-CM

## 2012-01-11 LAB — CBC WITH DIFFERENTIAL/PLATELET
Basophils Relative: 0.6 % (ref 0.0–3.0)
Eosinophils Relative: 0.2 % (ref 0.0–5.0)
Hemoglobin: 14.7 g/dL (ref 12.0–15.0)
MCV: 96.5 fl (ref 78.0–100.0)
Monocytes Absolute: 0.6 10*3/uL (ref 0.1–1.0)
Neutro Abs: 3.4 10*3/uL (ref 1.4–7.7)
Neutrophils Relative %: 46.7 % (ref 43.0–77.0)
RBC: 4.32 Mil/uL (ref 3.87–5.11)
WBC: 7.3 10*3/uL (ref 4.5–10.5)

## 2012-01-11 LAB — HEPATIC FUNCTION PANEL
AST: 22 U/L (ref 0–37)
Albumin: 4.1 g/dL (ref 3.5–5.2)
Alkaline Phosphatase: 59 U/L (ref 39–117)
Total Protein: 7.2 g/dL (ref 6.0–8.3)

## 2012-01-11 LAB — BASIC METABOLIC PANEL
BUN: 15 mg/dL (ref 6–23)
Chloride: 97 mEq/L (ref 96–112)
GFR: 73.75 mL/min (ref >60.00)
Glucose, Bld: 84 mg/dL (ref 70–99)
Potassium: 4.3 mEq/L (ref 3.5–5.1)

## 2012-01-11 LAB — URINALYSIS, ROUTINE W REFLEX MICROSCOPIC
Bilirubin Urine: NEGATIVE
Ketones, ur: NEGATIVE
Urobilinogen, UA: 0.2 (ref 0.0–1.0)

## 2012-01-11 MED ORDER — CEFUROXIME AXETIL 250 MG PO TABS: 250.0000 mg | ORAL_TABLET | Freq: Two times a day (BID) | ORAL | Status: AC

## 2012-01-11 MED ORDER — CEFUROXIME AXETIL 250 MG PO TABS: 250.0000 mg | ORAL_TABLET | Freq: Two times a day (BID) | ORAL | Status: DC

## 2012-01-11 NOTE — Progress Notes (Signed)
  Subjective:    Patient ID: Stacy Robinson, female    DOB: 1946/02/12, 66 y.o.   MRN: [id number]  HPI C/o dark cloudy urine x 3-4 wks C/o fatigue x 3-4 wks F/u LBP   Review of Systems  Constitutional: Positive for fatigue. Negative for chills, activity change, appetite change and unexpected weight change.  HENT: Negative for congestion, mouth sores and sinus pressure.   Eyes: Negative for visual disturbance.  Respiratory: Negative for cough and chest tightness.   Gastrointestinal: Negative for nausea and abdominal pain.  Genitourinary: Positive for dysuria and urgency. Negative for frequency, decreased urine volume, difficulty urinating and vaginal pain.  Musculoskeletal: Negative for back pain and gait problem.  Skin: Negative for pallor and rash.  Neurological: Negative for dizziness, tremors, weakness, numbness and headaches.  Psychiatric/Behavioral: Negative for suicidal ideas, confusion and sleep disturbance.       Objective:   Physical Exam  Constitutional: She appears well-developed. No distress.  HENT:  Head: Normocephalic.  Right Ear: External ear normal.  Left Ear: External ear normal.  Nose: Nose normal.  Mouth/Throat: Oropharynx is clear and moist.  Eyes: Conjunctivae are normal. Pupils are equal, round, and reactive to light. Right eye exhibits no discharge. Left eye exhibits no discharge.  Neck: Normal range of motion. Neck supple. No JVD present. No tracheal deviation present. No thyromegaly present.  Cardiovascular: Normal rate, regular rhythm and normal heart sounds.   Pulmonary/Chest: No stridor. No respiratory distress. She has no wheezes.  Abdominal: Soft. Bowel sounds are normal. She exhibits no distension and no mass. There is no tenderness. There is no rebound and no guarding.  Musculoskeletal: She exhibits no edema and no tenderness.  Lymphadenopathy:    She has no cervical adenopathy.  Neurological: She displays normal reflexes. No cranial nerve  deficit. She exhibits normal muscle tone. Coordination normal.  Skin: No rash noted. No erythema.  Psychiatric: She has a normal mood and affect. Her behavior is normal. Judgment and thought content normal.          Assessment & Plan:

## 2012-01-11 NOTE — Assessment & Plan Note (Signed)
Check UA 

## 2012-01-11 NOTE — Assessment & Plan Note (Addendum)
Check labs Treat UTI

## 2012-01-11 NOTE — Assessment & Plan Note (Signed)
Situational in 05/30/05 - husband died Resolved

## 2012-01-21 NOTE — Assessment & Plan Note (Signed)
resolved 

## 2012-01-21 NOTE — Assessment & Plan Note (Signed)
Resolved

## 2012-07-11 ENCOUNTER — Ambulatory Visit: Payer: Medicare PPO | Admitting: Internal Medicine

## 2012-11-03 ENCOUNTER — Telehealth: Payer: Self-pay | Admitting: Gastroenterology

## 2012-11-04 NOTE — Telephone Encounter (Signed)
ok 

## 2012-11-04 NOTE — Telephone Encounter (Signed)
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 The letter was mailed to the pt on 07/04/2008, but it does not list a recall time. There is a recall in the system, but it is for next year. Pt is adamant she is due this year.  Spoke with pt who reports rectal bleeding periodically. She also reports she is not constipated, but sometimes she has to sit for quite a while to pass the BM. The stools are not usually hard, but are loose. OK to schedule a repeat COLON or does pt need to be seen? Thanks.

## 2012-11-04 NOTE — Telephone Encounter (Signed)
Scheduled pt for PV on 12/01/12 and her COLON is 12/09/12 at 3:30pm; pt stated understanding.

## 2012-11-18 ENCOUNTER — Encounter: Payer: Medicare PPO | Admitting: Gastroenterology

## 2012-12-09 ENCOUNTER — Encounter: Payer: Medicare PPO | Admitting: Gastroenterology

## 2013-03-13 ENCOUNTER — Ambulatory Visit (INDEPENDENT_AMBULATORY_CARE_PROVIDER_SITE_OTHER): Payer: Medicare PPO | Admitting: Internal Medicine

## 2013-03-13 ENCOUNTER — Other Ambulatory Visit (INDEPENDENT_AMBULATORY_CARE_PROVIDER_SITE_OTHER): Payer: Medicare PPO

## 2013-03-13 ENCOUNTER — Encounter: Payer: Self-pay | Admitting: Internal Medicine

## 2013-03-13 VITALS — BP 120/80 | HR 80 | Temp 97.7°F | Resp 16 | Ht 68.0 in | Wt 153.0 lb

## 2013-03-13 DIAGNOSIS — R945 Abnormal results of liver function studies: Secondary | ICD-10-CM

## 2013-03-13 DIAGNOSIS — E785 Hyperlipidemia, unspecified: Secondary | ICD-10-CM

## 2013-03-13 DIAGNOSIS — Z0000 Encounter for general adult medical examination without abnormal findings: Secondary | ICD-10-CM

## 2013-03-13 DIAGNOSIS — R5383 Other fatigue: Secondary | ICD-10-CM

## 2013-03-13 DIAGNOSIS — R5381 Other malaise: Secondary | ICD-10-CM

## 2013-03-13 LAB — LIPID PANEL
Total CHOL/HDL Ratio: 3
VLDL: 23.4 mg/dL (ref 0.0–40.0)

## 2013-03-13 LAB — URINALYSIS, ROUTINE W REFLEX MICROSCOPIC
Nitrite: NEGATIVE
Specific Gravity, Urine: 1.015 (ref 1.000–1.030)
Urine Glucose: NEGATIVE
Urobilinogen, UA: 0.2 (ref 0.0–1.0)

## 2013-03-13 LAB — CBC WITH DIFFERENTIAL/PLATELET
Basophils Absolute: 0 10*3/uL (ref 0.0–0.1)
Eosinophils Relative: 0.6 % (ref 0.0–5.0)
Lymphocytes Relative: 42.1 % (ref 12.0–46.0)
Monocytes Relative: 8.5 % (ref 3.0–12.0)
Platelets: 204 10*3/uL (ref 150.0–400.0)
RDW: 12.9 % (ref 11.5–14.6)
WBC: 5.8 10*3/uL (ref 4.5–10.5)

## 2013-03-13 LAB — HEPATIC FUNCTION PANEL
ALT: 21 U/L (ref 0–35)
AST: 22 U/L (ref 0–37)
Alkaline Phosphatase: 56 U/L (ref 39–117)
Bilirubin, Direct: 0.1 mg/dL (ref 0.0–0.3)
Total Bilirubin: 0.7 mg/dL (ref 0.3–1.2)

## 2013-03-13 LAB — BASIC METABOLIC PANEL
BUN: 10 mg/dL (ref 6–23)
Calcium: 9 mg/dL (ref 8.4–10.5)
GFR: 94.41 mL/min (ref >60.00)
Glucose, Bld: 89 mg/dL (ref 70–99)
Potassium: 4.3 mEq/L (ref 3.5–5.1)
Sodium: 136 mEq/L (ref 135–145)

## 2013-03-13 LAB — LDL CHOLESTEROL, DIRECT: Direct LDL: 113 mg/dL

## 2013-03-13 LAB — TSH: TSH: 0.93 u[IU]/mL (ref 0.35–5.50)

## 2013-03-13 NOTE — Assessment & Plan Note (Signed)
Continue with current prescription therapy as reflected on the Med list.  

## 2013-03-13 NOTE — Progress Notes (Signed)
   Subjective:    HPI  The patient is here for a wellness exam. The patient has been doing well overall without major physical or psychological issues going on lately.  Wt Readings from Last 3 Encounters:  03/13/13 153 lb (69.4 kg)  01/11/12 146 lb (66.225 kg)  05/22/10 150 lb 8 oz (68.266 kg)    BP Readings from Last 3 Encounters:  03/13/13 120/80  01/11/12 112/50  05/22/10 100/60      Review of Systems  Constitutional: Positive for fatigue. Negative for chills, activity change, appetite change and unexpected weight change.  HENT: Negative for congestion, sore throat, mouth sores, neck pain, neck stiffness, voice change and sinus pressure.   Eyes: Negative for visual disturbance.  Respiratory: Negative for cough and chest tightness.   Gastrointestinal: Negative for nausea, vomiting and abdominal pain.  Genitourinary: Positive for dysuria and urgency. Negative for frequency, decreased urine volume, difficulty urinating and vaginal pain.  Musculoskeletal: Negative for back pain and gait problem.  Skin: Negative for pallor and rash.  Neurological: Negative for dizziness, tremors, weakness, numbness and headaches.  Psychiatric/Behavioral: Negative for suicidal ideas, confusion, sleep disturbance and decreased concentration. The patient is not nervous/anxious.        Objective:   Physical Exam  Constitutional: She appears well-developed. No distress.  HENT:  Head: Normocephalic.  Right Ear: External ear normal.  Left Ear: External ear normal.  Nose: Nose normal.  Mouth/Throat: Oropharynx is clear and moist.  Eyes: Conjunctivae are normal. Pupils are equal, round, and reactive to light. Right eye exhibits no discharge. Left eye exhibits no discharge.  Neck: Normal range of motion. Neck supple. No JVD present. No tracheal deviation present. No thyromegaly present.  Cardiovascular: Normal rate, regular rhythm and normal heart sounds.   Pulmonary/Chest: No stridor. No  respiratory distress. She has no wheezes.  Abdominal: Soft. Bowel sounds are normal. She exhibits no distension and no mass. There is no tenderness. There is no rebound and no guarding.  Musculoskeletal: She exhibits no edema and no tenderness.  Lymphadenopathy:    She has no cervical adenopathy.  Neurological: She displays normal reflexes. No cranial nerve deficit. She exhibits normal muscle tone. Coordination normal.  Skin: No rash noted. No erythema.  Psychiatric: She has a normal mood and affect. Her behavior is normal. Judgment and thought content normal.    Lab Results  Component Value Date   WBC 5.8 03/13/2013   HGB 14.6 03/13/2013   HCT 42.3 03/13/2013   PLT 204.0 03/13/2013   GLUCOSE 89 03/13/2013   CHOL 231* 03/13/2013   TRIG 117.0 03/13/2013   HDL 80.80 03/13/2013   LDLDIRECT 113.0 03/13/2013   ALT 21 03/13/2013   AST 22 03/13/2013   NA 136 03/13/2013   K 4.3 03/13/2013   CL 102 03/13/2013   CREATININE 0.7 03/13/2013   BUN 10 03/13/2013   CO2 26 03/13/2013   TSH 0.93 03/13/2013         Assessment & Plan:

## 2013-03-13 NOTE — Assessment & Plan Note (Signed)

## 2013-05-24 ENCOUNTER — Encounter: Payer: Self-pay | Admitting: Internal Medicine

## 2013-05-24 ENCOUNTER — Ambulatory Visit (INDEPENDENT_AMBULATORY_CARE_PROVIDER_SITE_OTHER): Payer: Medicare PPO | Admitting: Internal Medicine

## 2013-05-24 VITALS — BP 108/78 | HR 84 | Temp 97.9°F | Ht 68.0 in | Wt 154.0 lb

## 2013-05-24 DIAGNOSIS — L237 Allergic contact dermatitis due to plants, except food: Secondary | ICD-10-CM

## 2013-05-24 DIAGNOSIS — L255 Unspecified contact dermatitis due to plants, except food: Secondary | ICD-10-CM

## 2013-05-24 MED ORDER — METHYLPREDNISOLONE ACETATE 80 MG/ML IJ SUSP
80.0000 mg | Freq: Once | INTRAMUSCULAR | Status: AC
  Administered 2013-05-24: 80 mg via INTRAMUSCULAR

## 2013-05-24 NOTE — Patient Instructions (Signed)
Poison Ivy Poison ivy is a inflammation of the skin (contact dermatitis) caused by touching the allergens on the leaves of the ivy plant following previous exposure to the plant. The rash usually appears 48 hours after exposure. The rash is usually bumps (papules) or blisters (vesicles) in a linear pattern. Depending on your own sensitivity, the rash may simply cause redness and itching, or it may also progress to blisters which may break open. These must be well cared for to prevent secondary bacterial (germ) infection, followed by scarring. Keep any open areas dry, clean, dressed, and covered with an antibacterial ointment if needed. The eyes may also get puffy. The puffiness is worst in the morning and gets better as the day progresses. This dermatitis usually heals without scarring, within 2 to 3 weeks without treatment. HOME CARE INSTRUCTIONS  Thoroughly wash with soap and water as soon as you have been exposed to poison ivy. You have about one half hour to remove the plant resin before it will cause the rash. This washing will destroy the oil or antigen on the skin that is causing, or will cause, the rash. Be sure to wash under your fingernails as any plant resin there will continue to spread the rash. Do not rub skin vigorously when washing affected area. Poison ivy cannot spread if no oil from the plant remains on your body. A rash that has progressed to weeping sores will not spread the rash unless you have not washed thoroughly. It is also important to wash any clothes you have been wearing as these may carry active allergens. The rash will return if you wear the unwashed clothing, even several days later. Avoidance of the plant in the future is the best measure. Poison ivy plant can be recognized by the number of leaves. Generally, poison ivy has three leaves with flowering branches on a single stem. Diphenhydramine may be purchased over the counter and used as needed for itching. Do not drive with  this medication if it makes you drowsy.Ask your caregiver about medication for children. SEEK MEDICAL CARE IF:  Open sores develop.  Redness spreads beyond area of rash.  You notice purulent (pus-like) discharge.  You have increased pain.  Other signs of infection develop (such as fever). Document Released: 12/11/2000 Document Revised: 03/07/2012 Document Reviewed: 10/30/2009 Exit[employer] Patient Information 2014 [employer].  

## 2013-05-24 NOTE — Progress Notes (Signed)
  Subjective:    Patient ID: Stacy Robinson, female    DOB: 1946-06-22, 67 y.o.   MRN: [id number]  HPI  Pt presents to the clinic today with c/o a rash. This started yesterday. It is located on her arms and abdomen. It is very itchy. She thinks it may be poison ivy. She has not put anything on it. She has had poison ivy before. She does like to work out in the yard.  Review of Systems  No past medical history on file.  Current Outpatient Prescriptions  Medication Sig Dispense Refill  . b complex vitamins tablet Take 1 tablet by mouth daily.      . Cholecalciferol 1000 UNITS tablet Take 1,000 Units by mouth daily.      . Cyanocobalamin (VITAMIN B 12 PO) Take by mouth daily.      . Magnesium 100 MG TABS Take by mouth daily.      . Potassium (POTASSIMIN PO) Take by mouth daily.      . Prenatal Vit-Fe Sulfate-FA (PRENATAL VITAMIN PO) Take by mouth daily.       No current facility-administered medications for this visit.    Allergies  Allergen Reactions  . Sodium Sulfate     No family history on file.  History   Social History  . Marital Status: Widowed    Spouse Name: N/A    Number of Children: N/A  . Years of Education: N/A   Occupational History  . Not on file.   Social History Main Topics  . Smoking status: Former Games developer  . Smokeless tobacco: Not on file  . Alcohol Use: Yes  . Drug Use: No  . Sexually Active: Yes   Other Topics Concern  . Not on file   Social History Narrative  . No narrative on file     Constitutional: Denies fever, malaise, fatigue, headache or abrupt weight changes. .  Skin: Pt reports rash. Denies redness, lesions or ulcercations.  .   No other specific complaints in a complete review of systems (except as listed in HPI above).     Objective:   Physical Exam  BP 108/78  Pulse 84  Temp(Src) 97.9 F (36.6 C) (Oral)  Ht 5\' 8"  (1.727 m)  Wt 154 lb (69.854 kg)  BMI 23.42 kg/m2  SpO2 97% Wt Readings from Last 3 Encounters:   05/24/13 154 lb (69.854 kg)  03/13/13 153 lb (69.4 kg)  01/11/12 146 lb (66.225 kg)    General: Appears their stated age, well developed, well nourished in NAD. Skin: Warm, dry and intact. Contact dermatitis noted in linear fashion on arms and abdomen, resembling poison ivy. Cardiovascular: Normal rate and rhythm. S1,S2 noted.  No murmur, rubs or gallops noted. No JVD or BLE edema. No carotid bruits noted. Pulmonary/Chest: Normal effort and positive vesicular breath sounds. No respiratory distress. No wheezes, rales or ronchi noted.         Assessment & Plan:   Contact dermatitis secondary to poison ivy:  80 mg Depo IM given today Can you calamine lotion as needed  RTC as needed or if symptoms persist

## 2013-05-24 NOTE — Addendum Note (Signed)
Addended by: Brenton Grills C on: 05/24/2013 11:29 AM   Modules accepted: Orders

## 2013-05-25 ENCOUNTER — Telehealth: Payer: Self-pay | Admitting: *Deleted

## 2013-05-25 MED ORDER — PREDNISONE 10 MG PO TABS: ORAL_TABLET | ORAL | Status: DC

## 2013-05-25 NOTE — Telephone Encounter (Signed)
erx sent in

## 2013-05-25 NOTE — Addendum Note (Signed)
Addended by: Lorre Munroe on: 05/25/2013 04:00 PM   Modules accepted: Orders

## 2013-05-25 NOTE — Telephone Encounter (Signed)
Pt would like rx for poison ivy because it is spreading and Depo Medrol is not working.

## 2013-05-25 NOTE — Telephone Encounter (Signed)
Left msg on triage requesting call bck. Called pt back no answer LMOM RTC.../lmb 

## 2013-05-25 NOTE — Telephone Encounter (Signed)
Pt informed rx for Pred pack sent in.

## 2013-06-02 ENCOUNTER — Telehealth: Payer: Self-pay | Admitting: *Deleted

## 2013-06-02 NOTE — Telephone Encounter (Signed)
Left msg on triage requesting to speak with md Francine Graven is denying bill. Pt states [employer] has Dr. Jonny Ruiz down for her PCP & Dr. Posey Rea is her PCP that need to be change. Inform pt she need to contact [employer] so they can change in there system. Also pt states Francine Graven is denying coverage whn she came in to see NP [facility]. Inform her she need to speak with the billing dept gave #...Raechel Chute

## 2013-10-18 ENCOUNTER — Ambulatory Visit: Payer: Self-pay | Admitting: Women's Health

## 2014-02-27 ENCOUNTER — Telehealth: Payer: Self-pay | Admitting: Internal Medicine

## 2014-02-27 NOTE — Telephone Encounter (Signed)
OV Pls Thx

## 2014-02-27 NOTE — Telephone Encounter (Signed)
Pt has an appt.

## 2014-02-27 NOTE — Telephone Encounter (Signed)
Pt request referral for dermatologist due to rash. Pt is not sure she need to come in to see Dr. Camila Li for this or he can just refer her out. Please advise.

## 2014-03-02 ENCOUNTER — Encounter: Payer: Self-pay | Admitting: Internal Medicine

## 2014-03-02 ENCOUNTER — Ambulatory Visit (INDEPENDENT_AMBULATORY_CARE_PROVIDER_SITE_OTHER): Payer: Medicare PPO | Admitting: Internal Medicine

## 2014-03-02 VITALS — BP 130/84 | HR 80 | Temp 97.0°F | Resp 16 | Wt 165.0 lb

## 2014-03-02 DIAGNOSIS — L02419 Cutaneous abscess of limb, unspecified: Secondary | ICD-10-CM | POA: Insufficient documentation

## 2014-03-02 DIAGNOSIS — L03119 Cellulitis of unspecified part of limb: Secondary | ICD-10-CM

## 2014-03-02 DIAGNOSIS — L02519 Cutaneous abscess of unspecified hand: Secondary | ICD-10-CM

## 2014-03-02 MED ORDER — CEPHALEXIN 500 MG PO CAPS: 1000.0000 mg | ORAL_CAPSULE | Freq: Two times a day (BID) | ORAL | Status: DC

## 2014-03-02 MED ORDER — TRIAMCINOLONE ACETONIDE 0.5 % EX CREA: 1.0000 "application " | TOPICAL_CREAM | Freq: Three times a day (TID) | CUTANEOUS | Status: DC

## 2014-03-02 NOTE — Progress Notes (Signed)
Pre visit review using our clinic review tool, if applicable. No additional management support is needed unless otherwise documented below in the visit note. 

## 2014-03-02 NOTE — Progress Notes (Signed)
   Subjective:    HPI  C/o L wrist sore x 3 wks   Wt Readings from Last 3 Encounters:  03/02/14 165 lb (74.844 kg)  05/24/13 154 lb (69.854 kg)  03/13/13 153 lb (69.4 kg)    BP Readings from Last 3 Encounters:  03/02/14 130/84  05/24/13 108/78  03/13/13 120/80      Review of Systems  Constitutional: Positive for fatigue. Negative for chills, activity change, appetite change and unexpected weight change.  HENT: Negative for congestion, mouth sores, sinus pressure, sore throat and voice change.   Eyes: Negative for visual disturbance.  Respiratory: Negative for cough and chest tightness.   Gastrointestinal: Negative for nausea, vomiting and abdominal pain.  Genitourinary: Positive for dysuria and urgency. Negative for frequency, decreased urine volume, difficulty urinating and vaginal pain.  Musculoskeletal: Negative for back pain, gait problem, neck pain and neck stiffness.  Skin: Negative for pallor and rash.  Neurological: Negative for dizziness, tremors, weakness, numbness and headaches.  Psychiatric/Behavioral: Negative for suicidal ideas, confusion, sleep disturbance and decreased concentration. The patient is not nervous/anxious.        Objective:   Physical Exam  Constitutional: She appears well-developed. No distress.  HENT:  Head: Normocephalic.  Right Ear: External ear normal.  Left Ear: External ear normal.  Nose: Nose normal.  Mouth/Throat: Oropharynx is clear and moist.  Eyes: Conjunctivae are normal. Pupils are equal, round, and reactive to light. Right eye exhibits no discharge. Left eye exhibits no discharge.  Neck: Normal range of motion. Neck supple. No JVD present. No tracheal deviation present. No thyromegaly present.  Cardiovascular: Normal rate, regular rhythm and normal heart sounds.   Pulmonary/Chest: No stridor. No respiratory distress. She has no wheezes.  Abdominal: Soft. Bowel sounds are normal. She exhibits no distension and no mass. There  is no tenderness. There is no rebound and no guarding.  Musculoskeletal: She exhibits no edema and no tenderness.  Lymphadenopathy:    She has no cervical adenopathy.  Neurological: She displays normal reflexes. No cranial nerve deficit. She exhibits normal muscle tone. Coordination normal.  Skin: No rash noted. No erythema.  Psychiatric: She has a normal mood and affect. Her behavior is normal. Judgment and thought content normal.  L dorsal wrist abscess w/a scab 8 mm   Lab Results  Component Value Date   WBC 5.8 03/13/2013   HGB 14.6 03/13/2013   HCT 42.3 03/13/2013   PLT 204.0 03/13/2013   GLUCOSE 89 03/13/2013   CHOL 231* 03/13/2013   TRIG 117.0 03/13/2013   HDL 80.80 03/13/2013   LDLDIRECT 113.0 03/13/2013   ALT 21 03/13/2013   AST 22 03/13/2013   NA 136 03/13/2013   K 4.3 03/13/2013   CL 102 03/13/2013   CREATININE 0.7 03/13/2013   BUN 10 03/13/2013   CO2 26 03/13/2013   TSH 0.93 03/13/2013         Assessment & Plan:

## 2014-03-02 NOTE — Patient Instructions (Signed)
Compress with salt solution

## 2014-03-02 NOTE — Assessment & Plan Note (Addendum)
3/15 L small Keflex Triamc cream RTC if not resolved for bx

## 2014-03-16 ENCOUNTER — Ambulatory Visit (INDEPENDENT_AMBULATORY_CARE_PROVIDER_SITE_OTHER): Payer: Medicare PPO | Admitting: Internal Medicine

## 2014-03-16 ENCOUNTER — Other Ambulatory Visit (INDEPENDENT_AMBULATORY_CARE_PROVIDER_SITE_OTHER): Payer: Medicare PPO

## 2014-03-16 ENCOUNTER — Encounter: Payer: Self-pay | Admitting: Internal Medicine

## 2014-03-16 VITALS — BP 130/96 | HR 80 | Temp 98.2°F | Resp 16 | Wt 162.0 lb

## 2014-03-16 DIAGNOSIS — C44601 Unspecified malignant neoplasm of skin of unspecified upper limb, including shoulder: Secondary | ICD-10-CM

## 2014-03-16 DIAGNOSIS — R300 Dysuria: Secondary | ICD-10-CM

## 2014-03-16 LAB — URINALYSIS, ROUTINE W REFLEX MICROSCOPIC
BILIRUBIN URINE: NEGATIVE
Ketones, ur: 15 — AB
Leukocytes, UA: NEGATIVE
NITRITE: NEGATIVE
PH: 6 (ref 5.0–8.0)
SPECIFIC GRAVITY, URINE: 1.025 (ref 1.000–1.030)
TOTAL PROTEIN, URINE-UPE24: NEGATIVE
Urine Glucose: NEGATIVE
Urobilinogen, UA: 0.2 (ref 0.0–1.0)
WBC UA: NONE SEEN (ref 0–?)

## 2014-03-16 MED ORDER — FLUCONAZOLE 150 MG PO TABS: 150.0000 mg | ORAL_TABLET | Freq: Once | ORAL | Status: DC

## 2014-03-16 NOTE — Progress Notes (Deleted)
Pre visit review using our clinic review tool, if applicable. No additional management support is needed unless otherwise documented below in the visit note. 

## 2014-03-16 NOTE — Assessment & Plan Note (Signed)
3/15 L wrist - probable Cancer

## 2014-03-16 NOTE — Progress Notes (Signed)
Patient ID: Stacy Robinson, female   DOB: 04-26-1946, 68 y.o.   MRN: [id number]   Subjective:    HPI  C/o L wrist sore x 3 wks   Wt Readings from Last 3 Encounters:  03/16/14 162 lb (73.483 kg)  03/02/14 165 lb (74.844 kg)  05/24/13 154 lb (69.854 kg)    BP Readings from Last 3 Encounters:  03/16/14 130/96  03/02/14 130/84  05/24/13 108/78      Review of Systems  Constitutional: Positive for fatigue. Negative for chills, activity change, appetite change and unexpected weight change.  HENT: Negative for congestion, mouth sores, sinus pressure, sore throat and voice change.   Eyes: Negative for visual disturbance.  Respiratory: Negative for cough and chest tightness.   Gastrointestinal: Negative for nausea, vomiting and abdominal pain.  Genitourinary: Positive for dysuria and urgency. Negative for frequency, decreased urine volume, difficulty urinating and vaginal pain.  Musculoskeletal: Negative for back pain, gait problem, neck pain and neck stiffness.  Skin: Negative for pallor and rash.  Neurological: Negative for dizziness, tremors, weakness, numbness and headaches.  Psychiatric/Behavioral: Negative for suicidal ideas, confusion, sleep disturbance and decreased concentration. The patient is not nervous/anxious.        Objective:   Physical Exam  Constitutional: She appears well-developed. No distress.  HENT:  Head: Normocephalic.  Right Ear: External ear normal.  Left Ear: External ear normal.  Nose: Nose normal.  Mouth/Throat: Oropharynx is clear and moist.  Eyes: Conjunctivae are normal. Pupils are equal, round, and reactive to light. Right eye exhibits no discharge. Left eye exhibits no discharge.  Neck: Normal range of motion. Neck supple. No JVD present. No tracheal deviation present. No thyromegaly present.  Cardiovascular: Normal rate, regular rhythm and normal heart sounds.   Pulmonary/Chest: No stridor. No respiratory distress. She has no wheezes.   Abdominal: Soft. Bowel sounds are normal. She exhibits no distension and no mass. There is no tenderness. There is no rebound and no guarding.  Musculoskeletal: She exhibits no edema and no tenderness.  Lymphadenopathy:    She has no cervical adenopathy.  Neurological: She displays normal reflexes. No cranial nerve deficit. She exhibits normal muscle tone. Coordination normal.  Skin: No rash noted. No erythema.  Psychiatric: She has a normal mood and affect. Her behavior is normal. Judgment and thought content normal.  L dorsal wrist mass w/a scab 8 mm - not better   Lab Results  Component Value Date   WBC 5.8 03/13/2013   HGB 14.6 03/13/2013   HCT 42.3 03/13/2013   PLT 204.0 03/13/2013   GLUCOSE 89 03/13/2013   CHOL 231* 03/13/2013   TRIG 117.0 03/13/2013   HDL 80.80 03/13/2013   LDLDIRECT 113.0 03/13/2013   ALT 21 03/13/2013   AST 22 03/13/2013   NA 136 03/13/2013   K 4.3 03/13/2013   CL 102 03/13/2013   CREATININE 0.7 03/13/2013   BUN 10 03/13/2013   CO2 26 03/13/2013   TSH 0.93 03/13/2013         Assessment & Plan:

## 2014-03-16 NOTE — Progress Notes (Deleted)
Patient ID: Stacy Robinson, female   DOB: 12-Dec-1946, 68 y.o.   MRN: [id number]

## 2014-03-17 ENCOUNTER — Encounter: Payer: Self-pay | Admitting: Internal Medicine

## 2014-05-23 ENCOUNTER — Other Ambulatory Visit: Payer: Self-pay | Admitting: Dermatology

## 2014-10-11 ENCOUNTER — Encounter: Payer: Self-pay | Admitting: Gastroenterology

## 2014-12-07 ENCOUNTER — Encounter: Payer: Self-pay | Admitting: Internal Medicine

## 2014-12-07 ENCOUNTER — Ambulatory Visit (INDEPENDENT_AMBULATORY_CARE_PROVIDER_SITE_OTHER): Payer: Medicare PPO | Admitting: Internal Medicine

## 2014-12-07 ENCOUNTER — Other Ambulatory Visit (INDEPENDENT_AMBULATORY_CARE_PROVIDER_SITE_OTHER): Payer: Medicare PPO

## 2014-12-07 VITALS — BP 130/90 | HR 75 | Temp 98.3°F

## 2014-12-07 DIAGNOSIS — N3281 Overactive bladder: Secondary | ICD-10-CM | POA: Insufficient documentation

## 2014-12-07 DIAGNOSIS — R300 Dysuria: Secondary | ICD-10-CM

## 2014-12-07 LAB — URINALYSIS
BILIRUBIN URINE: NEGATIVE
Hgb urine dipstick: NEGATIVE
Ketones, ur: NEGATIVE
Leukocytes, UA: NEGATIVE
NITRITE: NEGATIVE
Specific Gravity, Urine: 1.015 (ref 1.000–1.030)
TOTAL PROTEIN, URINE-UPE24: NEGATIVE
URINE GLUCOSE: NEGATIVE
Urobilinogen, UA: 0.2 (ref 0.0–1.0)
pH: 5.5 (ref 5.0–8.0)

## 2014-12-07 MED ORDER — CEFUROXIME AXETIL 250 MG PO TABS: 250.0000 mg | ORAL_TABLET | Freq: Two times a day (BID) | ORAL | Status: DC

## 2014-12-07 MED ORDER — MIRABEGRON ER 50 MG PO TB24: 50.0000 mg | ORAL_TABLET | Freq: Every day | ORAL | Status: DC

## 2014-12-07 NOTE — Progress Notes (Signed)
   Subjective:    HPI  C/o urinary urgency and frequency, bladder prolapse   Wt Readings from Last 3 Encounters:  03/16/14 162 lb (73.483 kg)  03/02/14 165 lb (74.844 kg)  05/24/13 154 lb (69.854 kg)    BP Readings from Last 3 Encounters:  12/07/14 130/90  03/16/14 130/96  03/02/14 130/84      Review of Systems  Constitutional: Positive for fatigue. Negative for chills, activity change, appetite change and unexpected weight change.  HENT: Negative for congestion, mouth sores, sinus pressure, sore throat and voice change.   Eyes: Negative for visual disturbance.  Respiratory: Negative for cough and chest tightness.   Gastrointestinal: Negative for nausea, vomiting and abdominal pain.  Genitourinary: Positive for dysuria and urgency. Negative for frequency, decreased urine volume, difficulty urinating and vaginal pain.  Musculoskeletal: Negative for back pain, gait problem, neck pain and neck stiffness.  Skin: Negative for pallor and rash.  Neurological: Negative for dizziness, tremors, weakness, numbness and headaches.  Psychiatric/Behavioral: Negative for suicidal ideas, confusion, sleep disturbance and decreased concentration. The patient is not nervous/anxious.        Objective:   Physical Exam  Constitutional: She appears well-developed. No distress.  HENT:  Head: Normocephalic.  Right Ear: External ear normal.  Left Ear: External ear normal.  Nose: Nose normal.  Mouth/Throat: Oropharynx is clear and moist.  Eyes: Conjunctivae are normal. Pupils are equal, round, and reactive to light. Right eye exhibits no discharge. Left eye exhibits no discharge.  Neck: Normal range of motion. Neck supple. No JVD present. No tracheal deviation present. No thyromegaly present.  Cardiovascular: Normal rate, regular rhythm and normal heart sounds.   Pulmonary/Chest: No stridor. No respiratory distress. She has no wheezes.  Abdominal: Soft. Bowel sounds are normal. She exhibits no  distension and no mass. There is no tenderness. There is no rebound and no guarding.  Musculoskeletal: She exhibits no edema or tenderness.  Lymphadenopathy:    She has no cervical adenopathy.  Neurological: She displays normal reflexes. No cranial nerve deficit. She exhibits normal muscle tone. Coordination normal.  Skin: No rash noted. No erythema.  Psychiatric: She has a normal mood and affect. Her behavior is normal. Judgment and thought content normal.     Lab Results  Component Value Date   WBC 5.8 03/13/2013   HGB 14.6 03/13/2013   HCT 42.3 03/13/2013   PLT 204.0 03/13/2013   GLUCOSE 89 03/13/2013   CHOL 231* 03/13/2013   TRIG 117.0 03/13/2013   HDL 80.80 03/13/2013   LDLDIRECT 113.0 03/13/2013   ALT 21 03/13/2013   AST 22 03/13/2013   NA 136 03/13/2013   K 4.3 03/13/2013   CL 102 03/13/2013   CREATININE 0.7 03/13/2013   BUN 10 03/13/2013   CO2 26 03/13/2013   TSH 0.93 03/13/2013         Assessment & Plan:

## 2014-12-07 NOTE — Patient Instructions (Signed)
Overactive Bladder The bladder has two functions that are totally opposite of the other. One is to relax and stretch out so it can store urine (fills like a balloon), and the other is to contract and squeeze down so that it can empty the urine that it has stored. Proper functioning of the bladder is a complex mixing of these two functions. The filling and emptying of the bladder can be influenced by:  The bladder.  The spinal cord.  The brain.  The nerves going to the bladder.  Other organs that are closely related to the bladder such as prostate in males and the vagina in females. As your bladder fills with urine, nerve signals are sent from the bladder to the brain to tell you that you may need to urinate. Normal urination requires that the bladder squeeze down with sufficient strength to empty the bladder, but this also requires that the bladder squeeze down sufficiently long to finish the job. In addition the sphincter muscles, which normally keep you from leaking urine, must also relax so that the urine can pass. Coordination between the bladder muscle squeezing down and the sphincter muscles relaxing is required to make everything happen normally. With an overactive bladder sometimes the muscles of the bladder contract unexpectedly and involuntarily and this causes an urgent need to urinate. The normal response is to try to hold urine in by contracting the sphincter muscles. Sometimes the bladder contracts so strongly that the sphincter muscles cannot stop the urine from passing out and incontinence occurs. This kind of incontinence is called urge incontinence. Having an overactive bladder can be embarrassing and awkward. It can keep you from living life the way you want to. Many people think it is just something you have to put up with as you grow older or have certain health conditions. In fact, there are treatments that can help make your life easier and more pleasant. CAUSES  Many things  can cause an overactive bladder. Possibilities include:  Urinary tract infection or infection of nearby tissues such as the prostate.  Prostate enlargement.  In women, multiple pregnancies or surgery on the uterus or urethra.  Bladder stones, inflammation, or tumors.  Caffeine.  Alcohol.  Medications. For example, diuretics (drugs that help the body get rid of extra fluid) increase urine production. Some other medicines must be taken with lots of fluids.  Muscle or nerve weakness. This might be the result of a spinal cord injury, a stroke, multiple sclerosis, or Parkinson disease.  Diabetes can cause a high urine volume which fills the bladder so quickly that the normal urge to urinate is triggered very strongly. SYMPTOMS   Loss of bladder control. You feel the need to urinate and cannot make your body wait.  Sudden, strong urges to urinate.  Urinating 8 or more times a day.  Waking up to urinate two or more times a night. DIAGNOSIS  To decide if you have overactive bladder, your health care provider will probably:  Ask about symptoms you have noticed.  Ask about your overall health. This will include questions about any medications you are taking.  Do a physical examination. This will help determine if there are obvious blockages or other problems.  Order some tests. These might include:  A blood test to check for diabetes or other health issues that could be contributing to the problem.  Urine testing. This could measure the flow of urine and the pressure on the bladder.  A test of your neurological   system (the brain, spinal cord, and nerves). This is the system that senses the need to urinate. Some of these tests are called flow tests, bladder pressure tests, and electrical measurements of the sphincter muscle.  A bladder test to check whether it is emptying completely when you urinate.  Cystoscopy. This test uses a thin tube with a tiny camera on it. It offers a  look inside your urethra and bladder to see if there are problems.  Imaging tests. You might be given a contrast dye and then asked to urinate. X-rays are taken to see how your bladder is working. TREATMENT  An overactive bladder can be treated in many ways. The treatment will depend on the cause. Whether you have a mild or severe case also makes a difference. Often, treatment can be given in your health care provider's office or clinic. Be sure to discuss the different options with your caregiver. They include:  Behavioral treatments. These do not involve medication or surgery:  Bladder training. For this, you would follow a schedule to urinate at regular intervals. This helps you learn to control the urge to urinate. At first, you might be asked to wait a few minutes after feeling the urge. In time, you should be able to schedule bathroom visits an hour or more apart.  Kegel exercises. These exercises strengthen the pelvic floor muscles, which support the bladder. Toning these muscles can help control urination even if the bladder muscles are overactive. A specialist will teach you how to do these exercises correctly. They will require daily practice.  Weight loss. If you are obese or overweight, losing weight might stop your bladder from being overactive. Talk to your health care provider about how many pounds you should lose. Also ask if there is a specific program or method that would work best for you.  Diet change. This might be suggested if constipation is making your overactive bladder worse. Your health care provider or a nutritionist can explain ways to change what you eat to ease constipation. Other people might need to take in less caffeine or alcohol. Sometimes drinking fewer fluids is needed, too.  Protection. This is not an actual treatment. But, you could wear special pads to take care of any leakage while you wait for other treatments to take effect. This will help you avoid  embarrassment.  Physical treatments.  Electrical stimulation. Electrodes will send gentle pulses to the nerves or muscles that help control the bladder. The goal is to strengthen them. Sometimes this is done with the electrodes outside the body. Or, they might be placed inside the body (implanted). This treatment can take several months to have an effect.  Medications. These are usually used along with other treatments. Several medicines are available. Some are injected into the muscles involved in urination. Others come in pill form. Medications sometimes prescribed include:  Anticholinergics. These drugs block the signals that the nerves deliver to the bladder. This keeps it from releasing urine at the wrong time. Researchers think the drugs might help in other ways, too.  Imipramine. This is an antidepressant. But, it relaxes bladder muscles.  Botox. This is still experimental. Some people believe that injecting it into the bladder muscles will relax them so they work more normally. It has also been injected into the sphincter muscle when the sphincter muscle does not open properly. This is a temporary fix, however. Also, it might make matters worse, especially in older people.  Surgery.  A device might be implanted   to help manage your nerves. It works on the nerves that signal when you need to urinate.  Surgery is sometimes needed with electrical stimulation. If the electrodes are implanted, this is done through surgery.  Sometimes repairs need to be made through surgery. For example, the size of the bladder can be changed. This is usually done in severe cases only. HOME CARE INSTRUCTIONS   Take any medications your health care provider prescribed or suggested. Follow the directions carefully.  Practice any lifestyle changes that are recommended. These might include:  Drinking less fluid or drinking at different times of the day. If you need to urinate often during the night, for  example, you may need to stop drinking fluids early in the evening.  Cutting down on caffeine or alcohol. They can both make an overactive bladder worse. Caffeine is found in coffee, tea, and sodas.  Doing Kegel exercises to strengthen muscles.  Losing weight, if that is recommended.  Eating a healthy and balanced diet. This will help you avoid constipation.  Keep a journal or a log. You might be asked to record how much you drink and when, and also when you feel the need to urinate.  Learn how to care for implants or other devices, such as pessaries. SEEK MEDICAL CARE IF:   Your overactive bladder gets worse.  You feel increased pain or irritation when you urinate.  You notice blood in your urine.  You have questions about any medications or devices that your health care provider recommended.  You notice blood, pus, or swelling at the site of any test or treatment procedure.  You have an oral temperature above 102F (38.9C). SEEK IMMEDIATE MEDICAL CARE IF:  You have an oral temperature above 102F (38.9C), not controlled by medicine. Document Released: 10/10/2009 Document Revised: 04/30/2014 Document Reviewed: 10/10/2009 ExitCare Patient Information 2015 [employer]. This information is not intended to replace advice given to you by your health care provider. Make sure you discuss any questions you have with your health care provider.  

## 2014-12-07 NOTE — Progress Notes (Signed)
Pre visit review using our clinic review tool, if applicable. No additional management support is needed unless otherwise documented below in the visit note. 

## 2014-12-07 NOTE — Assessment & Plan Note (Signed)
Myrbetriq 50 mg/d Address bladder prolapse

## 2014-12-09 ENCOUNTER — Encounter: Payer: Self-pay | Admitting: Internal Medicine

## 2014-12-09 NOTE — Assessment & Plan Note (Signed)
Due to overactive bladder UA

## 2014-12-11 ENCOUNTER — Telehealth: Payer: Self-pay | Admitting: Internal Medicine

## 2014-12-11 NOTE — Progress Notes (Signed)
Notified pt with UA results...Johny Chess

## 2014-12-11 NOTE — Telephone Encounter (Signed)
Pt request urine result that was done yesterday. Please call pt

## 2014-12-11 NOTE — Telephone Encounter (Signed)
Notified pt with UA results (see lab)...Stacy Robinson

## 2015-02-01 ENCOUNTER — Encounter: Payer: Self-pay | Admitting: Internal Medicine

## 2015-02-01 ENCOUNTER — Ambulatory Visit (INDEPENDENT_AMBULATORY_CARE_PROVIDER_SITE_OTHER): Payer: Commercial Managed Care - HMO | Admitting: Internal Medicine

## 2015-02-01 VITALS — BP 138/80 | HR 85 | Temp 98.5°F | Wt 162.0 lb

## 2015-02-01 DIAGNOSIS — H269 Unspecified cataract: Secondary | ICD-10-CM

## 2015-02-01 DIAGNOSIS — N3281 Overactive bladder: Secondary | ICD-10-CM | POA: Diagnosis not present

## 2015-02-01 MED ORDER — MIRABEGRON ER 50 MG PO TB24
50.0000 mg | ORAL_TABLET | Freq: Every day | ORAL | Status: DC
Start: 2015-02-01 — End: 2015-12-06

## 2015-02-01 NOTE — Progress Notes (Signed)
Pre visit review using our clinic review tool, if applicable. No additional management support is needed unless otherwise documented below in the visit note. 

## 2015-02-01 NOTE — Progress Notes (Signed)
   Subjective:    HPI  C/o urinary urgency and frequency, bladder prolapse   Wt Readings from Last 3 Encounters:  02/01/15 162 lb (73.483 kg)  03/16/14 162 lb (73.483 kg)  03/02/14 165 lb (74.844 kg)    BP Readings from Last 3 Encounters:  02/01/15 138/80  12/07/14 130/90  03/16/14 130/96      Review of Systems  Constitutional: Positive for fatigue. Negative for chills, activity change, appetite change and unexpected weight change.  HENT: Negative for congestion, mouth sores, sinus pressure, sore throat and voice change.   Eyes: Negative for visual disturbance.  Respiratory: Negative for cough and chest tightness.   Gastrointestinal: Negative for nausea, vomiting and abdominal pain.  Genitourinary: Positive for dysuria and urgency. Negative for frequency, decreased urine volume, difficulty urinating and vaginal pain.  Musculoskeletal: Negative for back pain, gait problem, neck pain and neck stiffness.  Skin: Negative for pallor and rash.  Neurological: Negative for dizziness, tremors, weakness, numbness and headaches.  Psychiatric/Behavioral: Negative for suicidal ideas, confusion, sleep disturbance and decreased concentration. The patient is not nervous/anxious.        Objective:   Physical Exam  Constitutional: She appears well-developed. No distress.  HENT:  Head: Normocephalic.  Right Ear: External ear normal.  Left Ear: External ear normal.  Nose: Nose normal.  Mouth/Throat: Oropharynx is clear and moist.  Eyes: Conjunctivae are normal. Pupils are equal, round, and reactive to light. Right eye exhibits no discharge. Left eye exhibits no discharge.  Neck: Normal range of motion. Neck supple. No JVD present. No tracheal deviation present. No thyromegaly present.  Cardiovascular: Normal rate, regular rhythm and normal heart sounds.   Pulmonary/Chest: No stridor. No respiratory distress. She has no wheezes.  Abdominal: Soft. Bowel sounds are normal. She exhibits no  distension and no mass. There is no tenderness. There is no rebound and no guarding.  Musculoskeletal: She exhibits no edema or tenderness.  Lymphadenopathy:    She has no cervical adenopathy.  Neurological: She displays normal reflexes. No cranial nerve deficit. She exhibits normal muscle tone. Coordination normal.  Skin: No rash noted. No erythema.  Psychiatric: She has a normal mood and affect. Her behavior is normal. Judgment and thought content normal.     Lab Results  Component Value Date   WBC 5.8 03/13/2013   HGB 14.6 03/13/2013   HCT 42.3 03/13/2013   PLT 204.0 03/13/2013   GLUCOSE 89 03/13/2013   CHOL 231* 03/13/2013   TRIG 117.0 03/13/2013   HDL 80.80 03/13/2013   LDLDIRECT 113.0 03/13/2013   ALT 21 03/13/2013   AST 22 03/13/2013   NA 136 03/13/2013   K 4.3 03/13/2013   CL 102 03/13/2013   CREATININE 0.7 03/13/2013   BUN 10 03/13/2013   CO2 26 03/13/2013   TSH 0.93 03/13/2013         Assessment & Plan:  Patient ID: Stacy Robinson, female   DOB: 04/30/1946, 69 y.o.   MRN: [id number]

## 2015-02-01 NOTE — Assessment & Plan Note (Signed)
Urol ref Try Myrbetriq

## 2015-02-01 NOTE — Assessment & Plan Note (Signed)
Opht ref

## 2015-02-02 ENCOUNTER — Encounter: Payer: Self-pay | Admitting: Internal Medicine

## 2015-02-21 ENCOUNTER — Encounter: Payer: Self-pay | Admitting: Internal Medicine

## 2015-03-29 ENCOUNTER — Telehealth: Payer: Self-pay | Admitting: Internal Medicine

## 2015-03-29 NOTE — Telephone Encounter (Signed)
[facility] Patient Name: Stacy Robinson DOB: 05/06/1946 Initial Comment Caller states made appt April 11th, can't wait until then, bladder is always out and having pressure Nurse Assessment Nurse: Markus Daft, RN, Sherre Poot Date/Time (Eastern Time): 03/29/2015 2:22:46 PM Confirm and document reason for call. If symptomatic, describe symptoms. ---Caller states she feels that can't wait until then as bladder is bulging out, and having pelvic pressure. Gets the chills when she tries to urinate. Always a trace of blood in incontinent of urine, wears diaper. Bladder is numb. Has the patient traveled out of the country within the last 30 days? ---Not Applicable Does the patient require triage? ---Yes Related visit to physician within the last 2 weeks? ---No Seen in February by PCP as she's had several years, but could always push it back in before and never the pressure or s/s til now. And then scheduled with urologist for 04/08/15 (insurance took this long to approve). Then states that the doctor never looked at it so it may be bladder or uterine prolapse. Does the PT have any chronic conditions? (i.e. diabetes, asthma, etc.) ---Yes List chronic conditions. ---uterine or bladder prolapse? (unsure as doctor never looked at the area d/t patients requesting privacy). Guidelines Guideline Title Affirmed Question Affirmed Notes Vaginal Symptoms [1] Something is hanging out of the vagina AND [2] cannot easily be pushed back inside Final Disposition User See Physician within 4 Hours (or PCP triage) Markus Daft, RN, Windy Comments Caller wants to go tomorrow instead of today to be seen. She will call office back as the appts were not posted yet.

## 2015-03-30 ENCOUNTER — Ambulatory Visit: Payer: Commercial Managed Care - HMO | Admitting: Family Medicine

## 2015-04-02 ENCOUNTER — Telehealth: Payer: Self-pay | Admitting: *Deleted

## 2015-04-02 NOTE — Telephone Encounter (Signed)
[name] - Client Stacy Robinson Gender: Female DOB: [birth year] Age: 69 Y 11 M 3 D Return Phone Number: [phone] (Primary) Address: [address] City/State/Zip: [place] [address] Client [name] - Client Client Site [facility] - Day Physician Plotnikov, Alex Contact Type Call Call Type Triage / Clinical Relationship To Patient Self Appointment Disposition EMR Appointment Attempted - Not Scheduled Info pasted into Epic Yes Return Phone Number [phone] (Primary) Chief Complaint Abdominal Pain Initial Comment Caller states made appt April 11th, can't wait until then, bladder is always out and having pressure PreDisposition Home Care Nurse Assessment Nurse: Markus Daft, RN, Sherre Poot Date/Time [name] Time): 03/29/2015 2:22:46 PM Confirm and document reason for call. If symptomatic, describe symptoms. ---Caller states she feels that can't wait until then as bladder is bulging out, and having pelvic pressure. Gets the chills when she tries to urinate. Always a trace of blood in incontinent of urine, wears diaper. Bladder is numb. Has the patient traveled out of the country within the last 30 days? ---Not Applicable Does the patient require triage? ---Yes Related visit to physician within the last 2 weeks? ---No Seen in February by PCP as she's had several years, but could always push it back in before and never the pressure or s/s til now. And then scheduled with urologist for 04/08/15 (insurance took this long to approve). Then states that the doctor never looked at it so it may be bladder or uterine prolapse. Does the PT have any chronic conditions? (i.e. diabetes, asthma, etc.) ---Yes List chronic conditions. ---uterine or bladder prolapse? (unsure as doctor never looked at the area d/t patients requesting privacy). Guidelines Guideline Title Affirmed  Question Affirmed Notes Nurse Date/Time (Eastern Time) Vaginal Symptoms [1] Something is hanging out of the vagina AND [2] cannot easily be pushed back inside [name], [place], Sherre Poot 03/29/2015 2:33:23 PM PLEASE NOTE: All timestamps contained within this report are represented as [place] Standard Time. CONFIDENTIALTY NOTICE: This fax transmission is intended only for the addressee. It contains information that is legally privileged, confidential or otherwise protected from use or disclosure. If you are not the intended recipient, you are strictly prohibited from reviewing, disclosing, copying using or disseminating any of this information or taking any action in reliance on or regarding this information. If you have received this fax in error, please notify us immediately by telephone so that we can arrange for its return to [place]. Phone: [phone], Toll-Free: [phone], Fax: [phone] Page: 2 of 2 Call Id: [id number] [name]. Time [name] Time) Disposition Final User 03/29/2015 1:47:24 PM Attempt made - message left Markus Daft RN, Sherre Poot 03/29/2015 1:48:09 PM Attempt made - no message left [name] 03/29/2015 2:36:14 PM See Physician within 4 Hours (or PCP triage) Yes Markus Daft, RN, Kenton Kingfisher Understands: Yes Disagree/Comply: Comply

## 2015-04-04 DIAGNOSIS — H2513 Age-related nuclear cataract, bilateral: Secondary | ICD-10-CM | POA: Diagnosis not present

## 2015-04-08 DIAGNOSIS — N814 Uterovaginal prolapse, unspecified: Secondary | ICD-10-CM | POA: Diagnosis not present

## 2015-04-08 DIAGNOSIS — N816 Rectocele: Secondary | ICD-10-CM | POA: Diagnosis not present

## 2015-04-08 DIAGNOSIS — N3941 Urge incontinence: Secondary | ICD-10-CM | POA: Diagnosis not present

## 2015-04-08 DIAGNOSIS — N8111 Cystocele, midline: Secondary | ICD-10-CM | POA: Diagnosis not present

## 2015-05-01 DIAGNOSIS — R351 Nocturia: Secondary | ICD-10-CM | POA: Diagnosis not present

## 2015-05-01 DIAGNOSIS — R350 Frequency of micturition: Secondary | ICD-10-CM | POA: Diagnosis not present

## 2015-05-01 DIAGNOSIS — N3941 Urge incontinence: Secondary | ICD-10-CM | POA: Diagnosis not present

## 2015-05-09 DIAGNOSIS — N939 Abnormal uterine and vaginal bleeding, unspecified: Secondary | ICD-10-CM | POA: Diagnosis not present

## 2015-05-09 DIAGNOSIS — R102 Pelvic and perineal pain: Secondary | ICD-10-CM | POA: Diagnosis not present

## 2015-05-09 DIAGNOSIS — N950 Postmenopausal bleeding: Secondary | ICD-10-CM | POA: Diagnosis not present

## 2015-05-09 DIAGNOSIS — Z1151 Encounter for screening for human papillomavirus (HPV): Secondary | ICD-10-CM | POA: Diagnosis not present

## 2015-05-09 DIAGNOSIS — N812 Incomplete uterovaginal prolapse: Secondary | ICD-10-CM | POA: Diagnosis not present

## 2015-05-28 DIAGNOSIS — R312 Other microscopic hematuria: Secondary | ICD-10-CM | POA: Diagnosis not present

## 2015-05-28 DIAGNOSIS — R310 Gross hematuria: Secondary | ICD-10-CM | POA: Diagnosis not present

## 2015-05-28 DIAGNOSIS — N1330 Unspecified hydronephrosis: Secondary | ICD-10-CM | POA: Diagnosis not present

## 2015-05-30 DIAGNOSIS — R351 Nocturia: Secondary | ICD-10-CM | POA: Diagnosis not present

## 2015-05-30 DIAGNOSIS — N3941 Urge incontinence: Secondary | ICD-10-CM | POA: Diagnosis not present

## 2015-05-30 DIAGNOSIS — R350 Frequency of micturition: Secondary | ICD-10-CM | POA: Diagnosis not present

## 2015-06-05 ENCOUNTER — Telehealth: Payer: Self-pay | Admitting: Internal Medicine

## 2015-06-05 DIAGNOSIS — N814 Uterovaginal prolapse, unspecified: Secondary | ICD-10-CM | POA: Diagnosis not present

## 2015-06-05 DIAGNOSIS — N3946 Mixed incontinence: Secondary | ICD-10-CM

## 2015-06-05 NOTE — Telephone Encounter (Signed)
Pt called in and would like to get a second option and would like a referral to     [facility] [place]

## 2015-06-06 NOTE — Telephone Encounter (Signed)
Called pt no answer LMIOM md ok referral once appt get set-up will received call bck with appt info...Stacy Robinson

## 2015-06-06 NOTE — Telephone Encounter (Signed)
Ok Thx 

## 2015-06-28 ENCOUNTER — Telehealth: Payer: Self-pay

## 2015-06-28 NOTE — Telephone Encounter (Signed)
Called patient to see if a recent mammogram has been done. LMOVM to call back, need to know if recent mammogram has been done (where,when) and if not if pt would like to proceed with an order?

## 2015-07-03 DIAGNOSIS — N813 Complete uterovaginal prolapse: Secondary | ICD-10-CM | POA: Diagnosis not present

## 2015-07-03 DIAGNOSIS — N765 Ulceration of vagina: Secondary | ICD-10-CM | POA: Diagnosis not present

## 2015-07-10 DIAGNOSIS — N765 Ulceration of vagina: Secondary | ICD-10-CM | POA: Diagnosis not present

## 2015-07-10 DIAGNOSIS — N813 Complete uterovaginal prolapse: Secondary | ICD-10-CM | POA: Diagnosis not present

## 2015-07-10 DIAGNOSIS — R8290 Unspecified abnormal findings in urine: Secondary | ICD-10-CM | POA: Diagnosis not present

## 2015-07-17 DIAGNOSIS — N814 Uterovaginal prolapse, unspecified: Secondary | ICD-10-CM | POA: Diagnosis not present

## 2015-11-20 DIAGNOSIS — N393 Stress incontinence (female) (male): Secondary | ICD-10-CM | POA: Diagnosis not present

## 2015-11-20 DIAGNOSIS — N813 Complete uterovaginal prolapse: Secondary | ICD-10-CM | POA: Diagnosis not present

## 2015-12-06 ENCOUNTER — Ambulatory Visit (INDEPENDENT_AMBULATORY_CARE_PROVIDER_SITE_OTHER): Payer: Commercial Managed Care - HMO | Admitting: Internal Medicine

## 2015-12-06 ENCOUNTER — Encounter: Payer: Self-pay | Admitting: Internal Medicine

## 2015-12-06 VITALS — BP 140/70 | HR 82 | Wt 170.0 lb

## 2015-12-06 DIAGNOSIS — N3281 Overactive bladder: Secondary | ICD-10-CM

## 2015-12-06 DIAGNOSIS — Z01818 Encounter for other preprocedural examination: Secondary | ICD-10-CM | POA: Diagnosis not present

## 2015-12-06 NOTE — Progress Notes (Signed)
Subjective:  Patient ID: Stacy Robinson, female    DOB: [birth year]  Age: 69 y.o. MRN: [id number]  CC: No chief complaint on file.   HPI   IM consult Stacy Robinson presents for pre-op clearance  Req by Dr Johnnye Sima (surgery is planned for Dec 27th -HYSTERECTOMY VAGINAL / BSO / A&P REPAIR, SACRO SPINAL LIGAMENT FIXATION, MID-URETHRAL SLING WITH CYSTO )  Hx: stable severe OAB, uterine prolapse: mild stableallergies   Outpatient Prescriptions Prior to Visit  Medication Sig Dispense Refill  . b complex vitamins tablet Take 1 tablet by mouth daily.    . cefUROXime (CEFTIN) 250 MG tablet Take 1 tablet (250 mg total) by mouth 2 (two) times daily. 10 tablet 0  . Cholecalciferol 1000 UNITS tablet Take 1,000 Units by mouth daily.    . Cyanocobalamin (VITAMIN B 12 PO) Take by mouth daily.    . fluconazole (DIFLUCAN) 150 MG tablet Take 1 tablet (150 mg total) by mouth once. 1 tablet 1  . Magnesium 100 MG TABS Take by mouth daily.    . mirabegron ER (MYRBETRIQ) 50 MG TB24 tablet Take 1 tablet (50 mg total) by mouth daily. 30 tablet 11  . Potassium (POTASSIMIN PO) Take by mouth daily.    . Prenatal Vit-Fe Sulfate-FA (PRENATAL VITAMIN PO) Take by mouth daily.    [name] triamcinolone cream (KENALOG) 0.5 % Apply 1 application topically 3 (three) times daily. 30 g 1   No facility-administered medications prior to visit.   No past medical history on file. No past surgical history on file.  reports that she has quit smoking. She does not have any smokeless tobacco history on file. She reports that she drinks alcohol. She reports that she does not use illicit drugs. family history is not on file. Allergies  Allergen Reactions  . Metronidazole Nausea Only and Shortness Of Breath    Muscle aches  . Ciprofloxacin     spasms  . Sodium Sulfate   . Sulfamethoxazole Cough   History reviewed. No pertinent past medical history. History reviewed. No pertinent past surgical history.  reports that  she has quit smoking. She does not have any smokeless tobacco history on file. She reports that she drinks alcohol. She reports that she does not use illicit drugs. family history is not on file. Allergies  Allergen Reactions  . Metronidazole Nausea Only and Shortness Of Breath    Muscle aches  . Ciprofloxacin     spasms  . Sodium Sulfate   . Sulfamethoxazole Cough     ROS Review of Systems  Constitutional: Negative for chills, activity change, appetite change, fatigue and unexpected weight change.  HENT: Negative for congestion, mouth sores and sinus pressure.   Eyes: Negative for visual disturbance.  Respiratory: Negative for cough and chest tightness.   Gastrointestinal: Negative for nausea and abdominal pain.  Genitourinary: Negative for frequency, difficulty urinating and vaginal pain.  Musculoskeletal: Negative for back pain and gait problem.  Skin: Negative for pallor and rash.  Neurological: Negative for dizziness, tremors, weakness, numbness and headaches.  Psychiatric/Behavioral: Negative for suicidal ideas, confusion and sleep disturbance.    Objective:  BP 140/70 mmHg  Pulse 82  Wt 170 lb (77.111 kg)  SpO2 96%  BP Readings from Last 3 Encounters:  12/06/15 140/70  02/01/15 138/80  12/07/14 130/90    Wt Readings from Last 3 Encounters:  12/06/15 170 lb (77.111 kg)  02/01/15 162 lb (73.483 kg)  03/16/14 162 lb (73.483 kg)  Physical Exam  Constitutional: She appears well-developed. No distress.  HENT:  Head: Normocephalic.  Right Ear: External ear normal.  Left Ear: External ear normal.  Nose: Nose normal.  Mouth/Throat: Oropharynx is clear and moist.  Eyes: Conjunctivae are normal. Pupils are equal, round, and reactive to light. Right eye exhibits no discharge. Left eye exhibits no discharge.  Neck: Normal range of motion. Neck supple. No JVD present. No tracheal deviation present. No thyromegaly present.  Cardiovascular: Normal rate, regular rhythm  and normal heart sounds.   Pulmonary/Chest: No stridor. No respiratory distress. She has no wheezes.  Abdominal: Soft. Bowel sounds are normal. She exhibits no distension and no mass. There is no tenderness. There is no rebound and no guarding.  Musculoskeletal: She exhibits no edema or tenderness.  Lymphadenopathy:    She has no cervical adenopathy.  Neurological: She displays normal reflexes. No cranial nerve deficit. She exhibits normal muscle tone. Coordination normal.  Skin: No rash noted. No erythema.  Psychiatric: She has a normal mood and affect. Her behavior is normal. Judgment and thought content normal.    Lab Results  Component Value Date   WBC 5.8 03/13/2013   HGB 14.6 03/13/2013   HCT 42.3 03/13/2013   PLT 204.0 03/13/2013   GLUCOSE 89 03/13/2013   CHOL 231* 03/13/2013   TRIG 117.0 03/13/2013   HDL 80.80 03/13/2013   LDLDIRECT 113.0 03/13/2013   ALT 21 03/13/2013   AST 22 03/13/2013   NA 136 03/13/2013   K 4.3 03/13/2013   CL 102 03/13/2013   CREATININE 0.7 03/13/2013   BUN 10 03/13/2013   CO2 26 03/13/2013   TSH 0.93 03/13/2013    No results found.  Assessment & Plan:   There are no diagnoses linked to this encounter. I am having Ms. Botting maintain her Magnesium, Potassium (POTASSIMIN PO), b complex vitamins, Cyanocobalamin (VITAMIN B 12 PO), Cholecalciferol, Prenatal Vit-Fe Sulfate-FA (PRENATAL VITAMIN PO), triamcinolone cream, fluconazole, cefUROXime, mirabegron ER, ferrous sulfate, and Prenatal MV-Min-Fe Fum-FA-DHA (PRENATAL 1 PO).  Meds ordered this encounter  Medications  . ferrous sulfate 325 (65 FE) MG tablet    Sig: Take 1 tablet by mouth daily.  . Prenatal MV-Min-Fe Fum-FA-DHA (PRENATAL 1 PO)    Sig: Take by mouth daily.     Follow-up: No Follow-up on file.  Walker Kehr, MD

## 2015-12-06 NOTE — Progress Notes (Signed)
Pre visit review using our clinic review tool, if applicable. No additional management support is needed unless otherwise documented below in the visit note. 

## 2015-12-07 NOTE — Assessment & Plan Note (Signed)
The pt is clear for surgery assuming her EKG, CXR and pre-op labs at [facility] are acceptable. Thank you!

## 2015-12-07 NOTE — Assessment & Plan Note (Addendum)
Pessary helped surgery is planned for Dec 27th -HYSTERECTOMY VAGINAL / BSO / A&P REPAIR, SACRO SPINAL LIGAMENT FIXATION, MID-URETHRAL SLING WITH CYSTO

## 2015-12-09 DIAGNOSIS — N72 Inflammatory disease of cervix uteri: Secondary | ICD-10-CM | POA: Diagnosis not present

## 2015-12-09 DIAGNOSIS — N393 Stress incontinence (female) (male): Secondary | ICD-10-CM | POA: Diagnosis not present

## 2015-12-09 DIAGNOSIS — Z79899 Other long term (current) drug therapy: Secondary | ICD-10-CM | POA: Diagnosis not present

## 2015-12-09 DIAGNOSIS — N814 Uterovaginal prolapse, unspecified: Secondary | ICD-10-CM | POA: Diagnosis not present

## 2015-12-09 DIAGNOSIS — N838 Other noninflammatory disorders of ovary, fallopian tube and broad ligament: Secondary | ICD-10-CM | POA: Diagnosis not present

## 2015-12-09 DIAGNOSIS — Z419 Encounter for procedure for purposes other than remedying health state, unspecified: Secondary | ICD-10-CM | POA: Diagnosis not present

## 2015-12-09 DIAGNOSIS — N888 Other specified noninflammatory disorders of cervix uteri: Secondary | ICD-10-CM | POA: Diagnosis not present

## 2015-12-19 DIAGNOSIS — Z419 Encounter for procedure for purposes other than remedying health state, unspecified: Secondary | ICD-10-CM | POA: Diagnosis not present

## 2015-12-19 DIAGNOSIS — N814 Uterovaginal prolapse, unspecified: Secondary | ICD-10-CM | POA: Diagnosis not present

## 2015-12-19 DIAGNOSIS — N813 Complete uterovaginal prolapse: Secondary | ICD-10-CM | POA: Diagnosis not present

## 2015-12-19 DIAGNOSIS — N72 Inflammatory disease of cervix uteri: Secondary | ICD-10-CM | POA: Diagnosis not present

## 2015-12-19 DIAGNOSIS — N393 Stress incontinence (female) (male): Secondary | ICD-10-CM | POA: Diagnosis not present

## 2015-12-19 DIAGNOSIS — Z79899 Other long term (current) drug therapy: Secondary | ICD-10-CM | POA: Diagnosis not present

## 2015-12-19 DIAGNOSIS — N838 Other noninflammatory disorders of ovary, fallopian tube and broad ligament: Secondary | ICD-10-CM | POA: Diagnosis not present

## 2015-12-19 DIAGNOSIS — N888 Other specified noninflammatory disorders of cervix uteri: Secondary | ICD-10-CM | POA: Diagnosis not present

## 2015-12-20 DIAGNOSIS — N814 Uterovaginal prolapse, unspecified: Secondary | ICD-10-CM | POA: Diagnosis not present

## 2015-12-20 DIAGNOSIS — N888 Other specified noninflammatory disorders of cervix uteri: Secondary | ICD-10-CM | POA: Diagnosis not present

## 2015-12-20 DIAGNOSIS — Z79899 Other long term (current) drug therapy: Secondary | ICD-10-CM | POA: Diagnosis not present

## 2015-12-20 DIAGNOSIS — N393 Stress incontinence (female) (male): Secondary | ICD-10-CM | POA: Diagnosis not present

## 2015-12-20 DIAGNOSIS — Z419 Encounter for procedure for purposes other than remedying health state, unspecified: Secondary | ICD-10-CM | POA: Diagnosis not present

## 2015-12-20 DIAGNOSIS — N838 Other noninflammatory disorders of ovary, fallopian tube and broad ligament: Secondary | ICD-10-CM | POA: Diagnosis not present

## 2015-12-20 DIAGNOSIS — N72 Inflammatory disease of cervix uteri: Secondary | ICD-10-CM | POA: Diagnosis not present

## 2016-01-29 DIAGNOSIS — R8290 Unspecified abnormal findings in urine: Secondary | ICD-10-CM | POA: Diagnosis not present

## 2016-01-29 DIAGNOSIS — Z09 Encounter for follow-up examination after completed treatment for conditions other than malignant neoplasm: Secondary | ICD-10-CM | POA: Diagnosis not present

## 2016-03-09 ENCOUNTER — Ambulatory Visit (INDEPENDENT_AMBULATORY_CARE_PROVIDER_SITE_OTHER): Payer: Commercial Managed Care - HMO | Admitting: Internal Medicine

## 2016-03-09 ENCOUNTER — Encounter: Payer: Self-pay | Admitting: Internal Medicine

## 2016-03-09 ENCOUNTER — Other Ambulatory Visit (INDEPENDENT_AMBULATORY_CARE_PROVIDER_SITE_OTHER): Payer: Commercial Managed Care - HMO

## 2016-03-09 VITALS — BP 124/70 | HR 75 | Wt 175.0 lb

## 2016-03-09 DIAGNOSIS — R432 Parageusia: Secondary | ICD-10-CM | POA: Insufficient documentation

## 2016-03-09 DIAGNOSIS — E559 Vitamin D deficiency, unspecified: Secondary | ICD-10-CM | POA: Diagnosis not present

## 2016-03-09 DIAGNOSIS — N3281 Overactive bladder: Secondary | ICD-10-CM

## 2016-03-09 DIAGNOSIS — J309 Allergic rhinitis, unspecified: Secondary | ICD-10-CM

## 2016-03-09 DIAGNOSIS — R202 Paresthesia of skin: Secondary | ICD-10-CM

## 2016-03-09 LAB — CBC WITH DIFFERENTIAL/PLATELET
BASOS ABS: 0 10*3/uL (ref 0.0–0.1)
BASOS PCT: 0.8 % (ref 0.0–3.0)
EOS PCT: 0.7 % (ref 0.0–5.0)
Eosinophils Absolute: 0 10*3/uL (ref 0.0–0.7)
HCT: 41.9 % (ref 36.0–46.0)
Hemoglobin: 14.4 g/dL (ref 12.0–15.0)
LYMPHS ABS: 2.6 10*3/uL (ref 0.7–4.0)
Lymphocytes Relative: 44.6 % (ref 12.0–46.0)
MCHC: 34.3 g/dL (ref 30.0–36.0)
MCV: 90.6 fl (ref 78.0–100.0)
MONO ABS: 0.5 10*3/uL (ref 0.1–1.0)
Monocytes Relative: 9 % (ref 3.0–12.0)
NEUTROS PCT: 44.9 % (ref 43.0–77.0)
Neutro Abs: 2.6 10*3/uL (ref 1.4–7.7)
PLATELETS: 239 10*3/uL (ref 150.0–400.0)
RBC: 4.63 Mil/uL (ref 3.87–5.11)
RDW: 13.1 % (ref 11.5–15.5)
WBC: 5.9 10*3/uL (ref 4.0–10.5)

## 2016-03-09 LAB — BASIC METABOLIC PANEL
BUN: 23 mg/dL (ref 6–23)
CALCIUM: 9.3 mg/dL (ref 8.4–10.5)
CO2: 27 meq/L (ref 19–32)
Chloride: 106 mEq/L (ref 96–112)
Creatinine, Ser: 0.69 mg/dL (ref 0.40–1.20)
GFR: 88.92 mL/min (ref >60.00)
Glucose, Bld: 89 mg/dL (ref 70–99)
Potassium: 4.5 mEq/L (ref 3.5–5.1)
SODIUM: 139 meq/L (ref 135–145)

## 2016-03-09 LAB — VITAMIN B12: VITAMIN B 12: 323 pg/mL (ref 211–911)

## 2016-03-09 LAB — TSH: TSH: 0.56 u[IU]/mL (ref 0.35–4.50)

## 2016-03-09 LAB — VITAMIN D 25 HYDROXY (VIT D DEFICIENCY, FRACTURES): VITD: 35.87 ng/mL (ref 30.00–100.00)

## 2016-03-09 NOTE — Patient Instructions (Signed)
Irrigate sinuses with NeilMed Baking soda toothpaste

## 2016-03-09 NOTE — Progress Notes (Signed)
Subjective:  Patient ID: Stacy Robinson, female    DOB: [birth year]  Age: 70 y.o. MRN: [id number]  CC: No chief complaint on file.   HPI Anastasiya V Helder presents for a lot of phlegm in the throat/nose. C/o "oily aftertaste" - started after last surgery. C/o clear mucus   Outpatient Prescriptions Prior to Visit  Medication Sig Dispense Refill  . b complex vitamins tablet Take 1 tablet by mouth daily.    . Cholecalciferol 1000 UNITS tablet Take 1,000 Units by mouth daily.    . Cyanocobalamin (VITAMIN B 12 PO) Take by mouth daily.    . ferrous sulfate 325 (65 FE) MG tablet Take 1 tablet by mouth daily.    . Magnesium 100 MG TABS Take by mouth daily.    . Potassium (POTASSIMIN PO) Take by mouth daily.    [name] triamcinolone cream (KENALOG) 0.5 % Apply 1 application topically 3 (three) times daily. 30 g 1  . Prenatal MV-Min-Fe Fum-FA-DHA (PRENATAL 1 PO) Take by mouth daily.    . Prenatal Vit-Fe Sulfate-FA (PRENATAL VITAMIN PO) Take by mouth daily.     No facility-administered medications prior to visit.    ROS Review of Systems  Constitutional: Negative for chills, activity change, appetite change, fatigue and unexpected weight change.  HENT: Positive for congestion, postnasal drip and rhinorrhea. Negative for dental problem, mouth sores and sinus pressure.   Eyes: Negative for visual disturbance.  Respiratory: Negative for cough, chest tightness and wheezing.   Cardiovascular: Negative for leg swelling.  Gastrointestinal: Negative for nausea, abdominal pain and diarrhea.  Genitourinary: Negative for frequency, difficulty urinating and vaginal pain.  Musculoskeletal: Negative for myalgias, back pain and gait problem.  Skin: Negative for pallor and rash.  Neurological: Negative for dizziness, tremors, weakness, numbness and headaches.  Psychiatric/Behavioral: Negative for suicidal ideas, confusion and sleep disturbance. The patient is not nervous/anxious.     Objective:  BP 124/70  mmHg  Pulse 75  Wt 175 lb (79.379 kg)  SpO2 99%  BP Readings from Last 3 Encounters:  03/09/16 124/70  12/06/15 140/70  02/01/15 138/80    Wt Readings from Last 3 Encounters:  03/09/16 175 lb (79.379 kg)  12/06/15 170 lb (77.111 kg)  02/01/15 162 lb (73.483 kg)    Physical Exam  Constitutional: She appears well-developed. No distress.  HENT:  Head: Normocephalic.  Right Ear: External ear normal.  Left Ear: External ear normal.  Nose: Nose normal.  Mouth/Throat: Oropharynx is clear and moist.  Eyes: Conjunctivae are normal. Pupils are equal, round, and reactive to light. Right eye exhibits no discharge. Left eye exhibits no discharge.  Neck: Normal range of motion. Neck supple. No JVD present. No tracheal deviation present. No thyromegaly present.  Cardiovascular: Normal rate, regular rhythm and normal heart sounds.   Pulmonary/Chest: No stridor. No respiratory distress. She has no wheezes.  Abdominal: Soft. Bowel sounds are normal. She exhibits no distension and no mass. There is no tenderness. There is no rebound and no guarding.  Musculoskeletal: She exhibits no edema or tenderness.  Lymphadenopathy:    She has no cervical adenopathy.  Neurological: She displays normal reflexes. No cranial nerve deficit. She exhibits normal muscle tone. Coordination normal.  Skin: No rash noted. No erythema.  Psychiatric: She has a normal mood and affect. Her behavior is normal. Judgment and thought content normal.    Lab Results  Component Value Date   WBC 5.8 03/13/2013   HGB 14.6 03/13/2013   HCT 42.3 03/13/2013  PLT 204.0 03/13/2013   GLUCOSE 89 03/13/2013   CHOL 231* 03/13/2013   TRIG 117.0 03/13/2013   HDL 80.80 03/13/2013   LDLDIRECT 113.0 03/13/2013   ALT 21 03/13/2013   AST 22 03/13/2013   NA 136 03/13/2013   K 4.3 03/13/2013   CL 102 03/13/2013   CREATININE 0.7 03/13/2013   BUN 10 03/13/2013   CO2 26 03/13/2013   TSH 0.93 03/13/2013    No results  found.  Assessment & Plan:    I have discontinued Ms. Klunk's Prenatal Vit-Fe Sulfate-FA (PRENATAL VITAMIN PO) and Prenatal MV-Min-Fe Fum-FA-DHA (PRENATAL 1 PO). I am also having her maintain her Magnesium, Potassium (POTASSIMIN PO), b complex vitamins, Cyanocobalamin (VITAMIN B 12 PO), Cholecalciferol, triamcinolone cream, and ferrous sulfate.  No orders of the defined types were placed in this encounter.     Follow-up: Return for Wellness Exam.  Walker Kehr, MD

## 2016-03-09 NOTE — Assessment & Plan Note (Signed)
Irrigate sinuses with NeilMed Baking soda toothpaste

## 2016-03-09 NOTE — Progress Notes (Signed)
Pre visit review using our clinic review tool, if applicable. No additional management support is needed unless otherwise documented below in the visit note. 

## 2016-03-09 NOTE — Assessment & Plan Note (Signed)
Better post-op 

## 2016-03-09 NOTE — Assessment & Plan Note (Signed)
3/17 since surgery - ?etiology Dental check Irrigate sinuses with NeilMed Baking soda toothpaste Labs

## 2016-05-27 ENCOUNTER — Telehealth: Payer: Self-pay | Admitting: Internal Medicine

## 2016-05-27 NOTE — Telephone Encounter (Signed)
Patient Name: Stacy Robinson DOB: 01/26/1946 Initial Comment over the weekend had some strain that caused some chest pain and trouble breathing. been taking pain pills and all have gone away. wants to know if this is something to worry about and if so what she should do call got disconnected Nurse Assessment Guidelines Guideline Title Affirmed Question Affirmed Notes Final Disposition User FINAL ATTEMPT MADE - no message left Ronnald Ramp, Therapist, sports, [employer]

## 2016-05-29 ENCOUNTER — Ambulatory Visit (INDEPENDENT_AMBULATORY_CARE_PROVIDER_SITE_OTHER): Payer: Commercial Managed Care - HMO | Admitting: Internal Medicine

## 2016-05-29 ENCOUNTER — Encounter: Payer: Self-pay | Admitting: Internal Medicine

## 2016-05-29 ENCOUNTER — Other Ambulatory Visit (INDEPENDENT_AMBULATORY_CARE_PROVIDER_SITE_OTHER): Payer: Commercial Managed Care - HMO

## 2016-05-29 ENCOUNTER — Inpatient Hospital Stay: Admission: RE | Admit: 2016-05-29 | Payer: Commercial Managed Care - HMO | Source: Ambulatory Visit

## 2016-05-29 VITALS — Wt 170.0 lb

## 2016-05-29 DIAGNOSIS — R0789 Other chest pain: Secondary | ICD-10-CM

## 2016-05-29 DIAGNOSIS — R072 Precordial pain: Secondary | ICD-10-CM

## 2016-05-29 DIAGNOSIS — R079 Chest pain, unspecified: Secondary | ICD-10-CM | POA: Insufficient documentation

## 2016-05-29 LAB — SEDIMENTATION RATE: SED RATE: 36 mm/h — AB (ref 0–30)

## 2016-05-29 LAB — CBC WITH DIFFERENTIAL/PLATELET
Basophils Absolute: 0 10*3/uL (ref 0.0–0.1)
Basophils Relative: 0.7 % (ref 0.0–3.0)
EOS PCT: 1.1 % (ref 0.0–5.0)
Eosinophils Absolute: 0.1 10*3/uL (ref 0.0–0.7)
HCT: 40.2 % (ref 36.0–46.0)
Hemoglobin: 13.6 g/dL (ref 12.0–15.0)
LYMPHS ABS: 2.4 10*3/uL (ref 0.7–4.0)
Lymphocytes Relative: 45.8 % (ref 12.0–46.0)
MCHC: 33.7 g/dL (ref 30.0–36.0)
MCV: 92 fl (ref 78.0–100.0)
MONO ABS: 0.5 10*3/uL (ref 0.1–1.0)
MONOS PCT: 8.6 % (ref 3.0–12.0)
NEUTROS ABS: 2.3 10*3/uL (ref 1.4–7.7)
NEUTROS PCT: 43.8 % (ref 43.0–77.0)
PLATELETS: 257 10*3/uL (ref 150.0–400.0)
RBC: 4.36 Mil/uL (ref 3.87–5.11)
RDW: 14.8 % (ref 11.5–15.5)
WBC: 5.3 10*3/uL (ref 4.0–10.5)

## 2016-05-29 LAB — HEPATIC FUNCTION PANEL
ALBUMIN: 4 g/dL (ref 3.5–5.2)
ALT: 18 U/L (ref 0–35)
AST: 17 U/L (ref 0–37)
Alkaline Phosphatase: 65 U/L (ref 39–117)
BILIRUBIN TOTAL: 0.3 mg/dL (ref 0.2–1.2)
Bilirubin, Direct: 0.1 mg/dL (ref 0.0–0.3)
Total Protein: 7.3 g/dL (ref 6.0–8.3)

## 2016-05-29 LAB — BASIC METABOLIC PANEL
BUN: 18 mg/dL (ref 6–23)
CALCIUM: 9.3 mg/dL (ref 8.4–10.5)
CO2: 28 meq/L (ref 19–32)
Chloride: 105 mEq/L (ref 96–112)
Creatinine, Ser: 0.69 mg/dL (ref 0.40–1.20)
GFR: 88.87 mL/min (ref >60.00)
GLUCOSE: 88 mg/dL (ref 70–99)
POTASSIUM: 4.7 meq/L (ref 3.5–5.1)
Sodium: 141 mEq/L (ref 135–145)

## 2016-05-29 LAB — D-DIMER, QUANTITATIVE (NOT AT ARMC): D DIMER QUANT: 0.58 ug{FEU}/mL — AB (ref 0.00–0.48)

## 2016-05-29 LAB — TROPONIN I: TNIDX: 0 ug/L (ref 0.00–0.06)

## 2016-05-29 MED ORDER — NITROGLYCERIN 0.4 MG SL SUBL: 0.4000 mg | SUBLINGUAL_TABLET | SUBLINGUAL | Status: DC | PRN

## 2016-05-29 MED ORDER — OXYCODONE HCL 5 MG PO TABS: 5.0000 mg | ORAL_TABLET | ORAL | Status: DC | PRN

## 2016-05-29 MED ORDER — FUROSEMIDE 20 MG PO TABS: 20.0000 mg | ORAL_TABLET | Freq: Every day | ORAL | Status: DC | PRN

## 2016-05-29 NOTE — Progress Notes (Signed)
Pre visit review using our clinic review tool, if applicable. No additional management support is needed unless otherwise documented below in the visit note. 

## 2016-05-29 NOTE — Progress Notes (Signed)
Subjective:  Patient ID: Stacy Robinson, female    DOB: 23-Jun-1946  Age: 70 y.o. MRN: [id number]  CC: Chest Pain   HPI Stacy Robinson presents for CP that started at night suddenly on Sat night in bed. The pain was 10/10 in intensity. Pt took Oxycodone and the pain went away. The pt had SOB. She almost went to ER. On Sun, Mon - CP pain was off and on. No CP since Tue.  Outpatient Prescriptions Prior to Visit  Medication Sig Dispense Refill  . b complex vitamins tablet Take 1 tablet by mouth daily.    . Cholecalciferol 1000 UNITS tablet Take 1,000 Units by mouth daily.    . Cyanocobalamin (VITAMIN B 12 PO) Take by mouth daily.    . ferrous sulfate 325 (65 FE) MG tablet Take 1 tablet by mouth daily.    . Magnesium 100 MG TABS Take by mouth daily.    . Potassium (POTASSIMIN PO) Take by mouth daily.    [name] triamcinolone cream (KENALOG) 0.5 % Apply 1 application topically 3 (three) times daily. 30 g 1   No facility-administered medications prior to visit.    ROS Review of Systems  Constitutional: Negative for chills, activity change, appetite change, fatigue and unexpected weight change.  HENT: Negative for congestion, mouth sores and sinus pressure.   Eyes: Negative for visual disturbance.  Respiratory: Negative for cough and chest tightness.   Gastrointestinal: Negative for nausea and abdominal pain.  Genitourinary: Negative for frequency, difficulty urinating and vaginal pain.  Musculoskeletal: Negative for back pain and gait problem.  Skin: Negative for pallor and rash.  Neurological: Negative for dizziness, tremors, weakness, numbness and headaches.  Psychiatric/Behavioral: Negative for confusion and sleep disturbance.    Objective:  Wt 170 lb (77.111 kg)  BP Readings from Last 3 Encounters:  03/09/16 124/70  12/06/15 140/70  02/01/15 138/80    Wt Readings from Last 3 Encounters:  05/29/16 170 lb (77.111 kg)  03/09/16 175 lb (79.379 kg)  12/06/15 170 lb (77.111 kg)      Physical Exam  Constitutional: She appears well-developed. No distress.  HENT:  Head: Normocephalic.  Right Ear: External ear normal.  Left Ear: External ear normal.  Nose: Nose normal.  Mouth/Throat: Oropharynx is clear and moist.  Eyes: Conjunctivae are normal. Pupils are equal, round, and reactive to light. Right eye exhibits no discharge. Left eye exhibits no discharge.  Neck: Normal range of motion. Neck supple. No JVD present. No tracheal deviation present. No thyromegaly present.  Cardiovascular: Normal rate, regular rhythm and normal heart sounds.   Pulmonary/Chest: No stridor. No respiratory distress. She has no wheezes.  Abdominal: Soft. Bowel sounds are normal. She exhibits no distension and no mass. There is no tenderness. There is no rebound and no guarding.  Musculoskeletal: She exhibits no edema or tenderness.  Lymphadenopathy:    She has no cervical adenopathy.  Neurological: She displays normal reflexes. No cranial nerve deficit. She exhibits normal muscle tone. Coordination normal.  Skin: No rash noted. No erythema.  Psychiatric: She has a normal mood and affect. Her behavior is normal. Judgment and thought content normal.   Procedure: EKG Indication: chest pain Impression: NSR. No acute changes.   Lab Results  Component Value Date   WBC 5.9 03/09/2016   HGB 14.4 03/09/2016   HCT 41.9 03/09/2016   PLT 239.0 03/09/2016   GLUCOSE 89 03/09/2016   CHOL 231* 03/13/2013   TRIG 117.0 03/13/2013   HDL 80.80 03/13/2013  LDLDIRECT 113.0 03/13/2013   ALT 21 03/13/2013   AST 22 03/13/2013   NA 139 03/09/2016   K 4.5 03/09/2016   CL 106 03/09/2016   CREATININE 0.69 03/09/2016   BUN 23 03/09/2016   CO2 27 03/09/2016   TSH 0.56 03/09/2016    No results found.  Assessment & Plan:   Stacy Robinson was seen today for chest pain.  Diagnoses and all orders for this visit:  Other chest pain -     EKG 12-Lead   I am having Stacy Robinson maintain her Magnesium,  Potassium (POTASSIMIN PO), b complex vitamins, Cyanocobalamin (VITAMIN B 12 PO), Cholecalciferol, triamcinolone cream, and ferrous sulfate.  No orders of the defined types were placed in this encounter.     Follow-up: No Follow-up on file.  Walker Kehr, MD

## 2016-05-29 NOTE — Patient Instructions (Signed)
Go to ER if chest pain 

## 2016-05-29 NOTE — Assessment & Plan Note (Addendum)
6/17 acute and severe CT angio chest Labs EKG ok To ER if the pain comes back

## 2016-05-30 ENCOUNTER — Encounter (HOSPITAL_COMMUNITY): Payer: Self-pay | Admitting: *Deleted

## 2016-05-30 ENCOUNTER — Emergency Department (HOSPITAL_COMMUNITY): Payer: Commercial Managed Care - HMO

## 2016-05-30 ENCOUNTER — Emergency Department (HOSPITAL_COMMUNITY)
Admission: EM | Admit: 2016-05-30 | Discharge: 2016-05-30 | Disposition: A | Discharge status: Home / Self Care | Payer: Commercial Managed Care - HMO | Attending: Emergency Medicine | Admitting: Emergency Medicine

## 2016-05-30 ENCOUNTER — Encounter: Payer: Self-pay | Admitting: Internal Medicine

## 2016-05-30 DIAGNOSIS — J9811 Atelectasis: Secondary | ICD-10-CM | POA: Diagnosis not present

## 2016-05-30 DIAGNOSIS — Z7982 Long term (current) use of aspirin: Secondary | ICD-10-CM | POA: Diagnosis not present

## 2016-05-30 DIAGNOSIS — R072 Precordial pain: Secondary | ICD-10-CM | POA: Diagnosis present

## 2016-05-30 DIAGNOSIS — R079 Chest pain, unspecified: Secondary | ICD-10-CM | POA: Diagnosis not present

## 2016-05-30 DIAGNOSIS — Z79899 Other long term (current) drug therapy: Secondary | ICD-10-CM | POA: Diagnosis not present

## 2016-05-30 DIAGNOSIS — Z87891 Personal history of nicotine dependence: Secondary | ICD-10-CM | POA: Insufficient documentation

## 2016-05-30 DIAGNOSIS — J90 Pleural effusion, not elsewhere classified: Secondary | ICD-10-CM | POA: Insufficient documentation

## 2016-05-30 LAB — I-STAT TROPONIN, ED
TROPONIN I, POC: 0 ng/mL (ref 0.00–0.08)
TROPONIN I, POC: 0 ng/mL (ref 0.00–0.08)
Troponin i, poc: 0.02 ng/mL (ref 0.00–0.08)

## 2016-05-30 LAB — BASIC METABOLIC PANEL
ANION GAP: 8 (ref 5–15)
BUN: 16 mg/dL (ref 6–20)
CO2: 23 mmol/L (ref 22–32)
Calcium: 8.8 mg/dL — ABNORMAL LOW (ref 8.9–10.3)
Chloride: 105 mmol/L (ref 101–111)
Creatinine, Ser: 0.7 mg/dL (ref 0.44–1.00)
Glucose, Bld: 115 mg/dL — ABNORMAL HIGH (ref 65–99)
POTASSIUM: 3.9 mmol/L (ref 3.5–5.1)
SODIUM: 136 mmol/L (ref 135–145)

## 2016-05-30 LAB — PROTIME-INR
INR: 1.03 (ref 0.00–1.49)
PROTHROMBIN TIME: 13.7 s (ref 11.6–15.2)

## 2016-05-30 LAB — CBC
HEMATOCRIT: 37.8 % (ref 36.0–46.0)
HEMOGLOBIN: 12.6 g/dL (ref 12.0–15.0)
MCH: 30.4 pg (ref 26.0–34.0)
MCHC: 33.3 g/dL (ref 30.0–36.0)
MCV: 91.3 fL (ref 78.0–100.0)
Platelets: 218 10*3/uL (ref 150–400)
RBC: 4.14 MIL/uL (ref 3.87–5.11)
RDW: 13.9 % (ref 11.5–15.5)
WBC: 10.6 10*3/uL — AB (ref 4.0–10.5)

## 2016-05-30 LAB — HEPATIC FUNCTION PANEL
ALT: 23 U/L (ref 14–54)
AST: 24 U/L (ref 15–41)
Albumin: 3.2 g/dL — ABNORMAL LOW (ref 3.5–5.0)
Alkaline Phosphatase: 57 U/L (ref 38–126)
BILIRUBIN INDIRECT: 0.4 mg/dL (ref 0.3–0.9)
Bilirubin, Direct: 0.1 mg/dL (ref 0.1–0.5)
TOTAL PROTEIN: 6.4 g/dL — AB (ref 6.5–8.1)
Total Bilirubin: 0.5 mg/dL (ref 0.3–1.2)

## 2016-05-30 LAB — D-DIMER, QUANTITATIVE (NOT AT ARMC): D DIMER QUANT: 0.42 ug{FEU}/mL (ref 0.00–0.50)

## 2016-05-30 MED ORDER — IOPAMIDOL (ISOVUE-370) INJECTION 76%
INTRAVENOUS | Status: AC
  Administered 2016-05-30: 100 mL
  Filled 2016-05-30: qty 100

## 2016-05-30 MED ORDER — AZITHROMYCIN 250 MG PO TABS: ORAL_TABLET | ORAL | Status: DC

## 2016-05-30 NOTE — Discharge Instructions (Signed)
Pleurisy  Pleurisy is an inflammation and swelling of the lining of the lungs (pleura). Because of this inflammation, it hurts to breathe. It can be aggravated by coughing, laughing, or deep breathing. Pleurisy is often caused by an underlying infection or disease.   HOME CARE INSTRUCTIONS   Monitor your pleurisy for any changes. The following actions may help to alleviate any discomfort you are experiencing:  · Medicine may help with pain. Only take over-the-counter or prescription medicines for pain, discomfort, or fever as directed by your health care provider.  · Only take antibiotic medicine as directed. Make sure to finish it even if you start to feel better.  SEEK MEDICAL CARE IF:   · Your pain is not controlled with medicine or is increasing.  · You have an increase in pus-like (purulent) secretions brought up with coughing.  SEEK IMMEDIATE MEDICAL CARE IF:   · You have blue or dark lips, fingernails, or toenails.  · You are coughing up blood.  · You have increased difficulty breathing.  · You have continuing pain unrelieved by medicine or pain lasting more than 1 week.  · You have pain that radiates into your neck, arms, or jaw.  · You develop increased shortness of breath or wheezing.  · You develop a fever, rash, vomiting, fainting, or other serious symptoms.  MAKE SURE YOU:  · Understand these instructions.    · Will watch your condition.    · Will get help right away if you are not doing well or get worse.        This information is not intended to replace advice given to you by your health care provider. Make sure you discuss any questions you have with your health care provider.     Document Released: 12/14/2005 Document Revised: 08/16/2013 Document Reviewed: 05/28/2013  Elsevier Interactive Patient Education ©2016 [employer] Inc[employer]

## 2016-05-30 NOTE — ED Notes (Signed)
Patient transported to CT 

## 2016-05-30 NOTE — ED Notes (Signed)
MD at bedside. 

## 2016-05-30 NOTE — ED Notes (Signed)
Pt. Ambulated to the restroom with 1 assist.

## 2016-05-30 NOTE — ED Notes (Signed)
Pt presents via [facility] from home with c/o chest pain x 1 week.  Pt was seen by Primary care yesterday, was told if it returned to come to ED.  Pt describes as sharp pain radiating to left arm, pain increased with movement and deep breath.  No significant hx.  Pt received 1 NTG en route dropping BP from 130s to 80s sys, also reports decrease in pain.  325 ASA given by Fire.  EKG ST, 22g L H.  BP-104/60.  Pt a x 4, NAD.

## 2016-05-30 NOTE — ED Provider Notes (Signed)
CSN: [id number]     Arrival date & time 05/30/16  [id number] History   First MD Initiated Contact with Patient 05/30/16 [phone]     Chief Complaint  Patient presents with  . Chest Pain     (Consider location/radiation/quality/duration/timing/severity/associated sxs/prior Treatment) HPI   70 year old female complaining of substernal chest pain which she describes as sharp and radiating to her shoulders and into her neck which worsens with inspiration and movement. She states she had similar symptoms last week and lasted for 3 days and then resolved. She did follow up with Dr. Dorann Lodge cough. She had blood work and EKG that she reports as normal yesterday. She states that the pain returned waking her from sleep at 1:30 last night. She was short of breath, and diaphoretic. She has had some increasing tiredness. She states that 7 years ago she had an injury to the chest wall from ice skating but has not had any recent injuries this time. She has no history of coronary artery disease, DVT, or PE.  History reviewed. No pertinent past medical history. Past Surgical History  Procedure Laterality Date  . Abdominal hysterectomy     Family History  Problem Relation Age of Onset  . Heart disease Mother   . Heart disease Maternal Grandmother    Social History  Substance Use Topics  . Smoking status: Former Research scientist (life sciences)  . Smokeless tobacco: None  . Alcohol Use: Yes   OB History    No data available     Review of Systems  All other systems reviewed and are negative.     Allergies  Metronidazole; Ciprofloxacin; Sodium sulfate; and Sulfamethoxazole  Home Medications   Prior to Admission medications   Medication Sig Start Date End Date Taking? Authorizing Provider  aspirin EC 81 MG tablet Take 162 mg by mouth daily as needed for mild pain.   Yes Historical Provider, MD  b complex vitamins tablet Take 1 tablet by mouth daily.   Yes Historical Provider, MD  Cholecalciferol 1000 UNITS tablet Take 1,000  Units by mouth daily.   Yes Historical Provider, MD  Cyanocobalamin (VITAMIN B 12 PO) Take 1 tablet by mouth daily.    Yes Historical Provider, MD  ferrous sulfate 325 (65 FE) MG tablet Take 1 tablet by mouth daily.   Yes Historical Provider, MD  Magnesium 100 MG TABS Take 100 mg by mouth daily.    Yes Historical Provider, MD  nitroGLYCERIN (NITROSTAT) 0.4 MG SL tablet Place 1 tablet (0.4 mg total) under the tongue every 5 (five) minutes as needed for chest pain. 05/29/16  Yes Aleksei Plotnikov V, MD  oxyCODONE (ROXICODONE) 5 MG immediate release tablet Take 1-2 tablets (5-10 mg total) by mouth every 4 (four) hours as needed for severe pain. 05/29/16  Yes Aleksei Plotnikov V, MD  POTASSIUM PO Take 1 tablet by mouth daily. "OTC"   Yes Historical Provider, MD  Simethicone (GAS-X PO) Take 2 capsules by mouth daily as needed.   Yes Historical Provider, MD  triamcinolone cream (KENALOG) 0.5 % Apply 1 application topically 3 (three) times daily. 03/02/14  Yes Aleksei Plotnikov V, MD  furosemide (LASIX) 20 MG tablet Take 1 tablet (20 mg total) by mouth daily as needed for edema. 05/29/16   Aleksei Plotnikov V, MD   BP 113/66 mmHg  Pulse 85  Temp(Src) 98.2 F (36.8 C) (Oral)  Resp 16  Ht 5\' 8"  (1.727 m)  Wt 77.111 kg  BMI 25.85 kg/m2  SpO2 100% Physical Exam  Constitutional:  She is oriented to person, place, and time. She appears well-developed and well-nourished.  HENT:  Head: Normocephalic and atraumatic.  Right Ear: External ear normal.  Left Ear: External ear normal.  Nose: Nose normal.  Mouth/Throat: Oropharynx is clear and moist.  Eyes: Conjunctivae and EOM are normal. Pupils are equal, round, and reactive to light.  Neck: Normal range of motion. Neck supple.  Cardiovascular: Normal rate, regular rhythm, normal heart sounds and intact distal pulses.   Pulmonary/Chest: Effort normal and breath sounds normal. She exhibits tenderness.    Abdominal: Soft. Bowel sounds are normal.    Musculoskeletal: Normal range of motion.  Neurological: She is alert and oriented to person, place, and time. She has normal reflexes.  Skin: Skin is warm and dry.  Psychiatric: She has a normal mood and affect. Her behavior is normal. Judgment and thought content normal.  Nursing note and vitals reviewed.   ED Course  Procedures (including critical care time) Labs Review Labs Reviewed  BASIC METABOLIC PANEL - Abnormal; Notable for the following:    Glucose, Bld 115 (*)    Calcium 8.8 (*)    All other components within normal limits  CBC - Abnormal; Notable for the following:    WBC 10.6 (*)    All other components within normal limits  HEPATIC FUNCTION PANEL - Abnormal; Notable for the following:    Total Protein 6.4 (*)    Albumin 3.2 (*)    All other components within normal limits  D-DIMER, QUANTITATIVE (NOT AT [facility])  PROTIME-INR  I-STAT TROPOININ, ED  I-STAT TROPOININ, ED  Randolm Idol, ED    Imaging Review Dg Chest 2 View  05/30/2016  CLINICAL DATA:  Chest pain radiating to left shoulder. EXAM: CHEST  2 VIEW COMPARISON:  12/30/2009 FINDINGS: Mild cardiomegaly. Focal airspace opacity noted at the left lung base which could represent atelectasis or infiltrate. Right lung is clear. No effusions. No acute bony abnormality. IMPRESSION: Left lower lobe atelectasis or infiltrate. Electronically Signed   By: Rolm Baptise M.D.   On: 05/30/2016 09:25   Ct Angio Chest Pe W/cm &/or Wo Cm  05/30/2016  CLINICAL DATA:  Recent surgery. Chest pain for 1 week. Evaluate for pulmonary embolus. EXAM: CT ANGIOGRAPHY CHEST WITH CONTRAST TECHNIQUE: Multidetector CT imaging of the chest was performed using the standard protocol during bolus administration of intravenous contrast. Multiplanar CT image reconstructions and MIPs were obtained to evaluate the vascular anatomy. CONTRAST:  100 cc of Isovue 370 COMPARISON:  05/30/2016 FINDINGS: Mediastinum/Lymph Nodes: Mild cardiac enlargement. Aortic  atherosclerosis noted. There is no mediastinal adenopathy. Right hilar lymph node is borderline enlarged measuring 10 mm. The main pulmonary artery appears patent. No lobar or segmental pulmonary artery filling defects identified. Lungs/Pleura: There is a small left pleural effusion identified. Atelectasis is identified within both lung bases, left greater than right. Calcified granulomas noted in the right lower lobe. Upper abdomen: The adrenal glands are unremarkable. The visualized portions of the liver and spleen are negative. Renal cysts are identified bilaterally. Musculoskeletal: Mild scoliosis and degenerative disc disease noted. No aggressive lytic or sclerotic bone lesions. Review of the MIP images confirms the above findings. IMPRESSION: 1. No evidence for acute pulmonary embolus. 2. Prior granulomatous disease 3. Small left pleural effusion and nibasilar atelectasis left greater than right. 4. Aortic atherosclerosis 5. Prior granulomatous disease. Electronically Signed   By: Kerby Moors M.D.   On: 05/30/2016 10:55   I have personally reviewed and evaluated these images and lab  results as part of my medical decision-making.   EKG Interpretation   Date/Time:  Saturday May 30 2016 08:41:30 EDT Ventricular Rate:  95 PR Interval:  114 QRS Duration: 130 QT Interval:  378 QTC Calculation: 475 R Axis:   13 Text Interpretation:  Sinus rhythm Borderline short PR interval Confirmed  by Math Brazie MD, Andee Poles [id number]) on 05/30/2016 9:07:45 AM Also confirmed by Nelani Schmelzle  MD, Andee Poles [phone]), editor Rolla Plate, Joelene Millin [phone])  on 05/30/2016 10:53:47  AM      MDM   Final diagnoses:  Pleurisy with effusion    70 year old female with pleuritic type chest pain. EKG shows no ischemic changes. She had troponin and repeat troponin that are both normal. X-rays and then went for left lower lobe infiltrate or atelectasis. She had a CT angiogram done which showed no evidence for acute pulmonary embolism but prior  granulomatous disease and a small left pleural effusion and right basilar atelectasis left greater than right. Patient does not appear to have pulmonary embolism on workup here. Pain does not appear to be cardiac in nature. Heart score is 3 with 2 points for age, 1 point for moderately suspicious history,  this may be secondary to infection. I suspect the pain is due to the effusion. Plan Zithromax and will refer to pulmonary for follow-up. She is advised of return precautions including worsened pain, dyspnea, nausea vomiting or fever. She and her daughter's voice understanding of plan.    Pattricia Boss, MD 05/30/16 [phone]

## 2016-06-01 ENCOUNTER — Telehealth: Payer: Self-pay

## 2016-06-01 ENCOUNTER — Inpatient Hospital Stay: Admission: RE | Admit: 2016-06-01 | Payer: Commercial Managed Care - HMO | Source: Ambulatory Visit

## 2016-06-01 NOTE — Telephone Encounter (Signed)
Patient called and said she went to ED this weekend. And they did a CT scan while she was there. So she is not going to her CT scan this morning. She just wanted you all to be aware. Thank you.

## 2016-06-01 NOTE — Telephone Encounter (Signed)
Noted. Thx.

## 2016-06-05 ENCOUNTER — Other Ambulatory Visit: Payer: Self-pay

## 2016-06-05 ENCOUNTER — Telehealth: Payer: Self-pay | Admitting: Internal Medicine

## 2016-06-05 MED ORDER — AZITHROMYCIN 250 MG PO TABS: ORAL_TABLET | ORAL | Status: DC

## 2016-06-05 NOTE — Telephone Encounter (Signed)
zpac refill sent to [facility]---double checked with dr plotnikov to make sure he wanted to REFILL zpac, he said yes---patient advised

## 2016-06-05 NOTE — Telephone Encounter (Signed)
Ok refill a Zpac OV if issues Thx

## 2016-06-05 NOTE — Telephone Encounter (Signed)
Pt called stating the antibiotic she received at the ER didn't seem to work. She's pretty sure the infection is still there and she's wondering if there may be another antibiotic that you can prescribe. She was just in here for a visit about a week ago. Pharmacy is [facility] on [address]

## 2016-06-05 NOTE — Telephone Encounter (Signed)
Faxed to [facility] [address]

## 2016-06-15 ENCOUNTER — Ambulatory Visit (INDEPENDENT_AMBULATORY_CARE_PROVIDER_SITE_OTHER): Payer: Commercial Managed Care - HMO | Admitting: Internal Medicine

## 2016-06-15 ENCOUNTER — Encounter: Payer: Self-pay | Admitting: Internal Medicine

## 2016-06-15 VITALS — BP 110/60 | HR 80 | Temp 98.2°F

## 2016-06-15 DIAGNOSIS — R072 Precordial pain: Secondary | ICD-10-CM | POA: Diagnosis not present

## 2016-06-15 DIAGNOSIS — R21 Rash and other nonspecific skin eruption: Secondary | ICD-10-CM | POA: Diagnosis not present

## 2016-06-15 MED ORDER — PREDNISONE 10 MG PO TABS: ORAL_TABLET | ORAL | Status: DC

## 2016-06-15 MED ORDER — METHYLPREDNISOLONE ACETATE 80 MG/ML IJ SUSP
80.0000 mg | Freq: Once | INTRAMUSCULAR | Status: AC
  Administered 2016-06-15: 80 mg via INTRAMUSCULAR

## 2016-06-15 MED ORDER — DIPHENHYDRAMINE HCL 25 MG PO TABS: 25.0000 mg | ORAL_TABLET | ORAL | Status: DC | PRN

## 2016-06-15 MED ORDER — TRIAMCINOLONE ACETONIDE 0.1 % EX CREA: 1.0000 "application " | TOPICAL_CREAM | Freq: Four times a day (QID) | CUTANEOUS | Status: DC

## 2016-06-15 NOTE — Assessment & Plan Note (Addendum)
Severe ?etiology: pool chemicals, po iron, etc Depo-medrol 80 mg IM Prednisone 10 mg: take 4 tabs a day x 3 days; then 3 tabs a day x 4 days; then 2 tabs a day x 4 days, then 1 tab a day x 6 days, then stop. Take pc.  Potential benefits of a short term steroid  use as well as potential risks  and complications were explained to the patient and were aknowledged. Benadryl Kenalog oint

## 2016-06-15 NOTE — Progress Notes (Signed)
Pre visit review using our clinic review tool, if applicable. No additional management support is needed unless otherwise documented below in the visit note. 

## 2016-06-15 NOTE — Assessment & Plan Note (Addendum)
6/17 acute and severe - pleurisy CT chest IMPRESSION: 1. No evidence for acute pulmonary embolus. 2. Prior granulomatous disease 3. Small left pleural effusion and nibasilar atelectasis left greater than right. 4. Aortic atherosclerosis 5. Prior granulomatous disease. Electronically Signed   By: Kerby Moors M.D.   On: 05/30/2016 10:55   Prednisone 10 mg: take 4 tabs a day x 3 days; then 3 tabs a day x 4 days; then 2 tabs a day x 4 days, then 1 tab a day x 6 days, then stop. Take pc.

## 2016-06-15 NOTE — Progress Notes (Signed)
Subjective:  Patient ID: Stacy Robinson, female    DOB: 15-Sep-1946  Age: 70 y.o. MRN: [id number]  CC: Rash   HPI   Stacy Robinson presents for CP f/u - better 4/10 at times. No new meds except for a new kind Iron pill C/o new red itchy rash "all over" x 1 week - much worse this wkend  Outpatient Prescriptions Prior to Visit  Medication Sig Dispense Refill  . aspirin EC 81 MG tablet Take 162 mg by mouth daily as needed for mild pain.    [name] b complex vitamins tablet Take 1 tablet by mouth daily.    . Cholecalciferol 1000 UNITS tablet Take 1,000 Units by mouth daily.    . Cyanocobalamin (VITAMIN B 12 PO) Take 1 tablet by mouth daily.     . ferrous sulfate 325 (65 FE) MG tablet Take 1 tablet by mouth daily.    . furosemide (LASIX) 20 MG tablet Take 1 tablet (20 mg total) by mouth daily as needed for edema. 30 tablet 2  . Magnesium 100 MG TABS Take 100 mg by mouth daily.     . nitroGLYCERIN (NITROSTAT) 0.4 MG SL tablet Place 1 tablet (0.4 mg total) under the tongue every 5 (five) minutes as needed for chest pain. 20 tablet 3  . oxyCODONE (ROXICODONE) 5 MG immediate release tablet Take 1-2 tablets (5-10 mg total) by mouth every 4 (four) hours as needed for severe pain. 30 tablet 0  . POTASSIUM PO Take 1 tablet by mouth daily. "OTC"    . Simethicone (GAS-X PO) Take 2 capsules by mouth daily as needed.    . triamcinolone cream (KENALOG) 0.5 % Apply 1 application topically 3 (three) times daily. 30 g 1  . azithromycin (ZITHROMAX Z-PAK) 250 MG tablet Per package instructions (Patient not taking: Reported on 06/15/2016) 6 each 0   No facility-administered medications prior to visit.    ROS Review of Systems  Constitutional: Negative for chills, activity change, appetite change, fatigue and unexpected weight change.  HENT: Negative for congestion, mouth sores and sinus pressure.   Eyes: Negative for visual disturbance.  Respiratory: Negative for cough and chest tightness.   Cardiovascular:  Positive for chest pain.  Gastrointestinal: Negative for nausea and abdominal pain.  Genitourinary: Negative for frequency, difficulty urinating and vaginal pain.  Musculoskeletal: Negative for back pain and gait problem.  Skin: Positive for rash. Negative for pallor.  Neurological: Negative for dizziness, tremors, weakness, numbness and headaches.  Psychiatric/Behavioral: Negative for confusion and sleep disturbance.    Objective:  BP 110/60 mmHg  Pulse 80  Temp(Src) 98.2 F (36.8 C) (Oral)  SpO2 96%  BP Readings from Last 3 Encounters:  06/15/16 110/60  05/30/16 127/65  03/09/16 124/70    Wt Readings from Last 3 Encounters:  05/30/16 170 lb (77.111 kg)  05/29/16 170 lb (77.111 kg)  03/09/16 175 lb (79.379 kg)    Physical Exam  Constitutional: She appears well-developed. No distress.  HENT:  Head: Normocephalic.  Right Ear: External ear normal.  Left Ear: External ear normal.  Nose: Nose normal.  Mouth/Throat: Oropharynx is clear and moist.  Eyes: Conjunctivae are normal. Pupils are equal, round, and reactive to light. Right eye exhibits no discharge. Left eye exhibits no discharge.  Neck: Normal range of motion. Neck supple. No JVD present. No tracheal deviation present. No thyromegaly present.  Cardiovascular: Normal rate, regular rhythm and normal heart sounds.   Pulmonary/Chest: No stridor. No respiratory distress. She has no  wheezes.  Abdominal: Soft. Bowel sounds are normal. She exhibits no distension and no mass. There is no tenderness. There is no rebound and no guarding.  Musculoskeletal: She exhibits no edema or tenderness.  Lymphadenopathy:    She has no cervical adenopathy.  Neurological: She displays normal reflexes. No cranial nerve deficit. She exhibits normal muscle tone. Coordination normal.  Skin: Rash noted. There is erythema.  Psychiatric: She has a normal mood and affect. Her behavior is normal. Judgment and thought content normal.  Extensive eryth  -papular confluent rash on chest, face, forearms, legs  Lab Results  Component Value Date   WBC 10.6* 05/30/2016   HGB 12.6 05/30/2016   HCT 37.8 05/30/2016   PLT 218 05/30/2016   GLUCOSE 115* 05/30/2016   CHOL 231* 03/13/2013   TRIG 117.0 03/13/2013   HDL 80.80 03/13/2013   LDLDIRECT 113.0 03/13/2013   ALT 23 05/30/2016   AST 24 05/30/2016   NA 136 05/30/2016   K 3.9 05/30/2016   CL 105 05/30/2016   CREATININE 0.70 05/30/2016   BUN 16 05/30/2016   CO2 23 05/30/2016   TSH 0.56 03/09/2016   INR 1.03 05/30/2016    Dg Chest 2 View  05/30/2016  CLINICAL DATA:  Chest pain radiating to left shoulder. EXAM: CHEST  2 VIEW COMPARISON:  12/30/2009 FINDINGS: Mild cardiomegaly. Focal airspace opacity noted at the left lung base which could represent atelectasis or infiltrate. Right lung is clear. No effusions. No acute bony abnormality. IMPRESSION: Left lower lobe atelectasis or infiltrate. Electronically Signed   By: Rolm Baptise M.D.   On: 05/30/2016 09:25   Ct Angio Chest Pe W/cm &/or Wo Cm  05/30/2016  CLINICAL DATA:  Recent surgery. Chest pain for 1 week. Evaluate for pulmonary embolus. EXAM: CT ANGIOGRAPHY CHEST WITH CONTRAST TECHNIQUE: Multidetector CT imaging of the chest was performed using the standard protocol during bolus administration of intravenous contrast. Multiplanar CT image reconstructions and MIPs were obtained to evaluate the vascular anatomy. CONTRAST:  100 cc of Isovue 370 COMPARISON:  05/30/2016 FINDINGS: Mediastinum/Lymph Nodes: Mild cardiac enlargement. Aortic atherosclerosis noted. There is no mediastinal adenopathy. Right hilar lymph node is borderline enlarged measuring 10 mm. The main pulmonary artery appears patent. No lobar or segmental pulmonary artery filling defects identified. Lungs/Pleura: There is a small left pleural effusion identified. Atelectasis is identified within both lung bases, left greater than right. Calcified granulomas noted in the right lower lobe.  Upper abdomen: The adrenal glands are unremarkable. The visualized portions of the liver and spleen are negative. Renal cysts are identified bilaterally. Musculoskeletal: Mild scoliosis and degenerative disc disease noted. No aggressive lytic or sclerotic bone lesions. Review of the MIP images confirms the above findings. IMPRESSION: 1. No evidence for acute pulmonary embolus. 2. Prior granulomatous disease 3. Small left pleural effusion and nibasilar atelectasis left greater than right. 4. Aortic atherosclerosis 5. Prior granulomatous disease. Electronically Signed   By: Kerby Moors M.D.   On: 05/30/2016 10:55    Assessment & Plan:   There are no diagnoses linked to this encounter. I have discontinued Ms. Sampath's azithromycin. I am also having her maintain her Magnesium, b complex vitamins, Cyanocobalamin (VITAMIN B 12 PO), Cholecalciferol, triamcinolone cream, ferrous sulfate, nitroGLYCERIN, oxyCODONE, furosemide, POTASSIUM PO, Simethicone (GAS-X PO), and aspirin EC.  No orders of the defined types were placed in this encounter.     Follow-up: No Follow-up on file.  Walker Kehr, MD

## 2016-06-18 ENCOUNTER — Ambulatory Visit: Payer: Commercial Managed Care - HMO | Admitting: Cardiovascular Disease

## 2016-06-18 ENCOUNTER — Telehealth: Payer: Self-pay | Admitting: Cardiovascular Disease

## 2016-06-18 NOTE — Telephone Encounter (Signed)
Closed encounter °

## 2016-06-19 ENCOUNTER — Encounter: Payer: Self-pay | Admitting: Cardiovascular Disease

## 2016-06-19 ENCOUNTER — Ambulatory Visit (INDEPENDENT_AMBULATORY_CARE_PROVIDER_SITE_OTHER): Payer: Commercial Managed Care - HMO | Admitting: Cardiovascular Disease

## 2016-06-19 VITALS — BP 94/66 | HR 86 | Ht 68.0 in | Wt 165.0 lb

## 2016-06-19 DIAGNOSIS — E785 Hyperlipidemia, unspecified: Secondary | ICD-10-CM

## 2016-06-19 DIAGNOSIS — Z79899 Other long term (current) drug therapy: Secondary | ICD-10-CM

## 2016-06-19 DIAGNOSIS — R079 Chest pain, unspecified: Secondary | ICD-10-CM

## 2016-06-19 DIAGNOSIS — Z0000 Encounter for general adult medical examination without abnormal findings: Secondary | ICD-10-CM | POA: Diagnosis not present

## 2016-06-19 NOTE — Assessment & Plan Note (Signed)
Recent onset of chest pain 2 weeks ago and again one week ago with jaw up her extremity radiation associated shortness of breath. A CT scan showed no evidence of coronary embolism but did show aortic atherosclerosis.We will obtain a exercise Myoview stress test to risk stratify her.

## 2016-06-19 NOTE — Assessment & Plan Note (Signed)
History of hyperlipidemia currently not on statin drugs. We'll recheck a lipid and liver profile

## 2016-06-19 NOTE — Patient Instructions (Signed)
Medication Instructions:  Your physician recommends that you continue on your current medications as directed. Please refer to the Current Medication list given to you today.   Labwork: Your physician recommends that you return for lab work AT [facility].   Testing/Procedures: Your physician has requested that you have en exercise stress myoview. For further information please visit [email or web address]. Please follow instruction sheet, as given.    Follow-Up: Your physician recommends that you schedule a follow-up appointment in: [place].     Any Other Special Instructions Will Be Listed Below (If Applicable).     If you need a refill on your cardiac medications before your next appointment, please call your pharmacy.

## 2016-06-19 NOTE — Progress Notes (Signed)
06/19/2016 Curley Grgas Helgeson   11/17/1946  [id number]  Primary Physician Walker Kehr, MD Primary Cardiologist: Lorretta Harp MD Renae Gloss  HPI:   Ms Stacy Robinson is a 70 year old thin-appearing widowed Caucasian female mother of 2 children, grandmother of one grandchild referred by Dr. Mignon Pine cough for cardiovascular evaluation because of new onset chest pain. Risk factors are only notable for mild hyperlipidemia and remote tobacco abuse. She's never had a heart attack or stroke. She had new onset chest pain with rotation to her jaw and left upper extremity 2 weeks ago. This recurred one week later. She did have a CT angiogram which ruled out pulmonary embolus but did show aortic atherosclerotic changes.   Current Outpatient Prescriptions  Medication Sig Dispense Refill  . aspirin EC 81 MG tablet Take 162 mg by mouth daily as needed for mild pain.    [name] b complex vitamins tablet Take 1 tablet by mouth daily.    . diphenhydrAMINE (BENADRYL) 25 MG tablet Take 1-2 tablets (25-50 mg total) by mouth every 4 (four) hours as needed for itching or allergies. 100 tablet 3  . Magnesium 100 MG TABS Take 100 mg by mouth daily.     [name] oxyCODONE (ROXICODONE) 5 MG immediate release tablet Take 1-2 tablets (5-10 mg total) by mouth every 4 (four) hours as needed for severe pain. 30 tablet 0  . POTASSIUM PO Take 1 tablet by mouth daily. "OTC"    . predniSONE (DELTASONE) 10 MG tablet Prednisone 10 mg: take 4 tabs a day x 3 days; then 3 tabs a day x 4 days; then 2 tabs a day x 4 days, then 1 tab a day x 6 days, then stop. Take pc. 38 tablet 0   No current facility-administered medications for this visit.    Allergies  Allergen Reactions  . Metronidazole Nausea Only and Shortness Of Breath    Muscle aches  . Ciprofloxacin     spasms  . Sodium Sulfate   . Sulfamethoxazole Cough    Social History   Social History  . Marital Status: Widowed    Spouse Name: N/A  . Number of Children:  N/A  . Years of Education: N/A   Occupational History  . Not on file.   Social History Main Topics  . Smoking status: Former Research scientist (life sciences)  . Smokeless tobacco: Never Used  . Alcohol Use: 0.0 oz/week    0 Standard drinks or equivalent per week  . Drug Use: No  . Sexual Activity: Yes   Other Topics Concern  . Not on file   Social History Narrative     Review of Systems: General: negative for chills, fever, night sweats or weight changes.  Cardiovascular: negative for chest pain, dyspnea on exertion, edema, orthopnea, palpitations, paroxysmal nocturnal dyspnea or shortness of breath Dermatological: negative for rash Respiratory: negative for cough or wheezing Urologic: negative for hematuria Abdominal: negative for nausea, vomiting, diarrhea, bright red blood per rectum, melena, or hematemesis Neurologic: negative for visual changes, syncope, or dizziness All other systems reviewed and are otherwise negative except as noted above.    Blood pressure 94/66, pulse 86, height 5\' 8"  (1.727 m), weight 165 lb (74.844 kg).  General appearance: alert and no distress Neck: no adenopathy, no carotid bruit, no JVD, supple, symmetrical, trachea midline and thyroid not enlarged, symmetric, no tenderness/mass/nodules Lungs: clear to auscultation bilaterally Heart: regular rate and rhythm, S1, S2 normal, no murmur, click, rub or gallop Extremities: extremities normal, atraumatic,  no cyanosis or edema  EKG not performed today  ASSESSMENT AND PLAN:   HYPERLIPIDEMIA History of hyperlipidemia currently not on statin drugs. We'll recheck a lipid and liver profile  Chest pain Recent onset of chest pain 2 weeks ago and again one week ago with jaw up her extremity radiation associated shortness of breath. A CT scan showed no evidence of coronary embolism but did show aortic atherosclerosis.We will obtain a exercise Myoview stress test to risk stratify her.      Lorretta Harp MD  FACP,FACC,FAHA, [facility] 06/19/2016 10:37 AM

## 2016-06-23 ENCOUNTER — Telehealth: Payer: Self-pay | Admitting: Cardiovascular Disease

## 2016-06-23 NOTE — Telephone Encounter (Signed)
Discussed recent concerns w/ patient - she had exertional fatigue while mowing lawn for an hour the other day, she felt she would not be able to undergo stress test bc of this. She does note that she had no chest pain assoc w event. As I explained to patient, considering her recent symptoms of radiating chest pain, Dr. Gwenlyn Found had advised it would be appropriate from a cardiac risk stratification standpoint to have her undergo this test. After our discussion, patient cites willingness to undergo stress test. She asked that if possible we keep test for this week. She is aware I have sent a high priority msg to scheduling to get this arranged.

## 2016-06-23 NOTE — Telephone Encounter (Signed)
Pt c/o Shortness Of Breath: STAT if SOB developed within the last 24 hours or pt is noticeably SOB on the phone  1. Are you currently SOB (can you hear that pt is SOB on the phone)? no  2. How long have you been experiencing SOB? Thinks it has been a while- did not want to have stress test 6/30  3. Are you SOB when sitting or when up moving around? Moving around   4. Are you currently experiencing any other symptoms? Chest discomfort- stated- "not painful"

## 2016-06-24 ENCOUNTER — Telehealth (HOSPITAL_COMMUNITY): Payer: Self-pay

## 2016-06-24 NOTE — Telephone Encounter (Signed)
Encounter complete. 

## 2016-06-26 ENCOUNTER — Ambulatory Visit (HOSPITAL_COMMUNITY)
Admission: RE | Admit: 2016-06-26 | Discharge: 2016-06-26 | Disposition: A | Discharge status: Home / Self Care | Payer: Commercial Managed Care - HMO | Source: Ambulatory Visit | Attending: Internal Medicine | Admitting: Internal Medicine

## 2016-06-26 ENCOUNTER — Inpatient Hospital Stay (HOSPITAL_COMMUNITY): Admission: RE | Admit: 2016-06-26 | Payer: Commercial Managed Care - HMO | Source: Ambulatory Visit

## 2016-06-26 DIAGNOSIS — R079 Chest pain, unspecified: Secondary | ICD-10-CM | POA: Diagnosis not present

## 2016-06-26 DIAGNOSIS — Z8249 Family history of ischemic heart disease and other diseases of the circulatory system: Secondary | ICD-10-CM | POA: Diagnosis not present

## 2016-06-26 DIAGNOSIS — Z87891 Personal history of nicotine dependence: Secondary | ICD-10-CM | POA: Diagnosis not present

## 2016-06-26 DIAGNOSIS — R002 Palpitations: Secondary | ICD-10-CM | POA: Insufficient documentation

## 2016-06-26 DIAGNOSIS — Z0000 Encounter for general adult medical examination without abnormal findings: Secondary | ICD-10-CM | POA: Diagnosis not present

## 2016-06-26 DIAGNOSIS — R0602 Shortness of breath: Secondary | ICD-10-CM | POA: Diagnosis not present

## 2016-06-26 DIAGNOSIS — E785 Hyperlipidemia, unspecified: Secondary | ICD-10-CM | POA: Diagnosis not present

## 2016-06-26 DIAGNOSIS — R42 Dizziness and giddiness: Secondary | ICD-10-CM | POA: Insufficient documentation

## 2016-06-26 DIAGNOSIS — R5383 Other fatigue: Secondary | ICD-10-CM | POA: Diagnosis not present

## 2016-06-26 DIAGNOSIS — Z79899 Other long term (current) drug therapy: Secondary | ICD-10-CM | POA: Insufficient documentation

## 2016-06-26 LAB — MYOCARDIAL PERFUSION IMAGING
CHL CUP MPHR: 148 {beats}/min
CHL RATE OF PERCEIVED EXERTION: 16
CSEPEW: 7 METS
CSEPHR: 89 %
CSEPPHR: 133 {beats}/min
Exercise duration (min): 6 min
Exercise duration (sec): 1 s
LVDIAVOL: 61 mL (ref 46–106)
LVSYSVOL: 23 mL
NUC STRESS TID: 1.02
Rest HR: 69 {beats}/min
SDS: 1
SRS: 7
SSS: 8

## 2016-06-26 MED ORDER — TECHNETIUM TC 99M TETROFOSMIN IV KIT
31.0000 | PACK | Freq: Once | INTRAVENOUS | Status: AC | PRN
  Administered 2016-06-26: 31 via INTRAVENOUS
  Filled 2016-06-26: qty 31

## 2016-06-26 MED ORDER — TECHNETIUM TC 99M TETROFOSMIN IV KIT
10.8000 | PACK | Freq: Once | INTRAVENOUS | Status: AC | PRN
  Administered 2016-06-26: 11 via INTRAVENOUS
  Filled 2016-06-26: qty 11

## 2016-07-03 ENCOUNTER — Telehealth: Payer: Self-pay | Admitting: *Deleted

## 2016-07-03 NOTE — Telephone Encounter (Signed)
Patient left a vm requesting to let PCP know she is taking a OTC iron called Hema PLEX.

## 2016-07-06 NOTE — Telephone Encounter (Signed)
Ok Thx 

## 2016-10-20 DIAGNOSIS — H2513 Age-related nuclear cataract, bilateral: Secondary | ICD-10-CM | POA: Diagnosis not present

## 2016-10-28 ENCOUNTER — Ambulatory Visit: Admit: 2016-10-28 | Payer: Commercial Managed Care - HMO | Admitting: Ophthalmology

## 2016-10-28 SURGERY — PHACOEMULSIFICATION, CATARACT, WITH IOL INSERTION
Anesthesia: Choice | Laterality: Left

## 2016-12-04 DIAGNOSIS — H2513 Age-related nuclear cataract, bilateral: Secondary | ICD-10-CM | POA: Diagnosis not present

## 2016-12-04 DIAGNOSIS — Z0100 Encounter for examination of eyes and vision without abnormal findings: Secondary | ICD-10-CM | POA: Diagnosis not present

## 2016-12-04 DIAGNOSIS — H25013 Cortical age-related cataract, bilateral: Secondary | ICD-10-CM | POA: Diagnosis not present

## 2017-01-19 DIAGNOSIS — H2512 Age-related nuclear cataract, left eye: Secondary | ICD-10-CM | POA: Diagnosis not present

## 2017-01-19 DIAGNOSIS — H25012 Cortical age-related cataract, left eye: Secondary | ICD-10-CM | POA: Diagnosis not present

## 2017-01-19 DIAGNOSIS — H25812 Combined forms of age-related cataract, left eye: Secondary | ICD-10-CM | POA: Diagnosis not present

## 2017-05-05 DIAGNOSIS — N952 Postmenopausal atrophic vaginitis: Secondary | ICD-10-CM | POA: Diagnosis not present

## 2017-08-18 DIAGNOSIS — L814 Other melanin hyperpigmentation: Secondary | ICD-10-CM | POA: Diagnosis not present

## 2017-08-18 DIAGNOSIS — D485 Neoplasm of uncertain behavior of skin: Secondary | ICD-10-CM | POA: Diagnosis not present

## 2017-08-18 DIAGNOSIS — D225 Melanocytic nevi of trunk: Secondary | ICD-10-CM | POA: Diagnosis not present

## 2017-08-18 DIAGNOSIS — L821 Other seborrheic keratosis: Secondary | ICD-10-CM | POA: Diagnosis not present

## 2017-08-18 DIAGNOSIS — D1801 Hemangioma of skin and subcutaneous tissue: Secondary | ICD-10-CM | POA: Diagnosis not present

## 2017-08-18 DIAGNOSIS — R202 Paresthesia of skin: Secondary | ICD-10-CM | POA: Diagnosis not present

## 2017-08-18 DIAGNOSIS — C44519 Basal cell carcinoma of skin of other part of trunk: Secondary | ICD-10-CM | POA: Diagnosis not present

## 2017-08-24 ENCOUNTER — Ambulatory Visit: Payer: Commercial Managed Care - HMO | Admitting: Internal Medicine

## 2017-09-06 ENCOUNTER — Ambulatory Visit: Payer: Commercial Managed Care - HMO | Admitting: Internal Medicine

## 2017-09-07 ENCOUNTER — Encounter: Payer: Self-pay | Admitting: Internal Medicine

## 2017-09-07 ENCOUNTER — Ambulatory Visit (INDEPENDENT_AMBULATORY_CARE_PROVIDER_SITE_OTHER): Payer: Medicare HMO | Admitting: Internal Medicine

## 2017-09-07 ENCOUNTER — Other Ambulatory Visit (INDEPENDENT_AMBULATORY_CARE_PROVIDER_SITE_OTHER): Payer: Medicare HMO

## 2017-09-07 VITALS — BP 98/68 | HR 78 | Temp 97.9°F | Ht 68.0 in | Wt 164.0 lb

## 2017-09-07 DIAGNOSIS — Z0000 Encounter for general adult medical examination without abnormal findings: Secondary | ICD-10-CM

## 2017-09-07 DIAGNOSIS — G4700 Insomnia, unspecified: Secondary | ICD-10-CM

## 2017-09-07 DIAGNOSIS — J301 Allergic rhinitis due to pollen: Secondary | ICD-10-CM

## 2017-09-07 LAB — LIPID PANEL
CHOL/HDL RATIO: 3
CHOLESTEROL: 215 mg/dL — AB (ref 0–200)
HDL: 71.4 mg/dL (ref >39.00)
LDL CALC: 121 mg/dL — AB (ref 0–99)
NonHDL: 143.54
TRIGLYCERIDES: 115 mg/dL (ref 0.0–149.0)
VLDL: 23 mg/dL (ref 0.0–40.0)

## 2017-09-07 LAB — BASIC METABOLIC PANEL
BUN: 13 mg/dL (ref 6–23)
CHLORIDE: 104 meq/L (ref 96–112)
CO2: 27 mEq/L (ref 19–32)
Calcium: 9.6 mg/dL (ref 8.4–10.5)
Creatinine, Ser: 0.81 mg/dL (ref 0.40–1.20)
GFR: 73.59 mL/min (ref >60.00)
Glucose, Bld: 103 mg/dL — ABNORMAL HIGH (ref 70–99)
Potassium: 4.1 mEq/L (ref 3.5–5.1)
Sodium: 139 mEq/L (ref 135–145)

## 2017-09-07 LAB — CBC WITH DIFFERENTIAL/PLATELET
BASOS ABS: 0 10*3/uL (ref 0.0–0.1)
Basophils Relative: 0.4 % (ref 0.0–3.0)
EOS ABS: 0.1 10*3/uL (ref 0.0–0.7)
Eosinophils Relative: 0.8 % (ref 0.0–5.0)
HEMATOCRIT: 42 % (ref 36.0–46.0)
HEMOGLOBIN: 14.7 g/dL (ref 12.0–15.0)
LYMPHS PCT: 28.2 % (ref 12.0–46.0)
Lymphs Abs: 2.2 10*3/uL (ref 0.7–4.0)
MCHC: 34.9 g/dL (ref 30.0–36.0)
MCV: 93.4 fl (ref 78.0–100.0)
MONOS PCT: 7 % (ref 3.0–12.0)
Monocytes Absolute: 0.6 10*3/uL (ref 0.1–1.0)
Neutro Abs: 5 10*3/uL (ref 1.4–7.7)
Neutrophils Relative %: 63.6 % (ref 43.0–77.0)
Platelets: 209 10*3/uL (ref 150.0–400.0)
RBC: 4.5 Mil/uL (ref 3.87–5.11)
RDW: 14.2 % (ref 11.5–15.5)
WBC: 7.9 10*3/uL (ref 4.0–10.5)

## 2017-09-07 LAB — HEPATIC FUNCTION PANEL
ALT: 17 U/L (ref 0–35)
AST: 20 U/L (ref 0–37)
Albumin: 4 g/dL (ref 3.5–5.2)
Alkaline Phosphatase: 61 U/L (ref 39–117)
BILIRUBIN DIRECT: 0.1 mg/dL (ref 0.0–0.3)
BILIRUBIN TOTAL: 0.5 mg/dL (ref 0.2–1.2)
Total Protein: 6.9 g/dL (ref 6.0–8.3)

## 2017-09-07 LAB — URINALYSIS
Hgb urine dipstick: NEGATIVE
Leukocytes, UA: NEGATIVE
NITRITE: NEGATIVE
PH: 6 (ref 5.0–8.0)
SPECIFIC GRAVITY, URINE: 1.025 (ref 1.000–1.030)
Urine Glucose: NEGATIVE
Urobilinogen, UA: 0.2 (ref 0.0–1.0)

## 2017-09-07 LAB — TSH: TSH: 0.78 u[IU]/mL (ref 0.35–4.50)

## 2017-09-07 MED ORDER — VITAMIN D 1000 UNITS PO TABS: 1000.0000 [IU] | ORAL_TABLET | Freq: Every day | ORAL | 11 refills | Status: AC

## 2017-09-07 NOTE — Assessment & Plan Note (Signed)
Labs

## 2017-09-07 NOTE — Patient Instructions (Addendum)
Valerian root for sleep  Gluten free trial (no wheat products) for 4-6 weeks. OK to use gluten-free bread and gluten-free pasta.

## 2017-09-07 NOTE — Progress Notes (Signed)
Subjective:  Patient ID: Stacy Robinson, female    DOB: 09-21-1946  Age: 71 y.o. MRN: [id number]  CC: No chief complaint on file.   HPI    Stacy Robinson presents for a well exam/ MC exam C/o insomnia     Outpatient Medications Prior to Visit  Medication Sig Dispense Refill  . b complex vitamins tablet Take 1 tablet by mouth daily.    . diphenhydrAMINE (BENADRYL) 25 MG tablet Take 1-2 tablets (25-50 mg total) by mouth every 4 (four) hours as needed for itching or allergies. 100 tablet 3  . Magnesium 100 MG TABS Take 100 mg by mouth daily.     [name] oxyCODONE (ROXICODONE) 5 MG immediate release tablet Take 1-2 tablets (5-10 mg total) by mouth every 4 (four) hours as needed for severe pain. 30 tablet 0  . POTASSIUM PO Take 1 tablet by mouth daily. "OTC"    . aspirin EC 81 MG tablet Take 162 mg by mouth daily as needed for mild pain.    . predniSONE (DELTASONE) 10 MG tablet Prednisone 10 mg: take 4 tabs a day x 3 days; then 3 tabs a day x 4 days; then 2 tabs a day x 4 days, then 1 tab a day x 6 days, then stop. Take pc. (Patient not taking: Reported on 09/07/2017) 38 tablet 0   No facility-administered medications prior to visit.     ROS Review of Systems  Constitutional: Negative for activity change, appetite change, chills, fatigue and unexpected weight change.  HENT: Negative for congestion, mouth sores and sinus pressure.   Eyes: Negative for visual disturbance.  Respiratory: Negative for cough and chest tightness.   Gastrointestinal: Negative for abdominal pain and nausea.  Genitourinary: Negative for difficulty urinating, frequency and vaginal pain.  Musculoskeletal: Negative for back pain and gait problem.  Skin: Negative for pallor and rash.  Neurological: Negative for dizziness, tremors, weakness, numbness and headaches.  Psychiatric/Behavioral: Negative for confusion and sleep disturbance.    Objective:  BP 98/68 (BP Location: Left Arm, Patient Position: Sitting, Cuff  Size: Normal)   Pulse 78   Temp 97.9 F (36.6 C) (Oral)   Ht 5\' 8"  (1.727 m)   Wt 164 lb (74.4 kg)   SpO2 98%   BMI 24.94 kg/m   BP Readings from Last 3 Encounters:  09/07/17 98/68  06/19/16 94/66  06/15/16 110/60    Wt Readings from Last 3 Encounters:  09/07/17 164 lb (74.4 kg)  06/26/16 165 lb (74.8 kg)  06/19/16 165 lb (74.8 kg)    Physical Exam  Constitutional: She appears well-developed. No distress.  HENT:  Head: Normocephalic.  Right Ear: External ear normal.  Left Ear: External ear normal.  Nose: Nose normal.  Mouth/Throat: Oropharynx is clear and moist.  Eyes: Pupils are equal, round, and reactive to light. Conjunctivae are normal. Right eye exhibits no discharge. Left eye exhibits no discharge.  Neck: Normal range of motion. Neck supple. No JVD present. No tracheal deviation present. No thyromegaly present.  Cardiovascular: Normal rate, regular rhythm and normal heart sounds.   Pulmonary/Chest: No stridor. No respiratory distress. She has no wheezes.  Abdominal: Soft. Bowel sounds are normal. She exhibits no distension and no mass. There is no tenderness. There is no rebound and no guarding.  Musculoskeletal: She exhibits no edema or tenderness.  Lymphadenopathy:    She has no cervical adenopathy.  Neurological: She displays normal reflexes. No cranial nerve deficit. She exhibits normal muscle tone. Coordination  normal.  Skin: No rash noted. No erythema.  Psychiatric: She has a normal mood and affect. Her behavior is normal. Judgment and thought content normal.  trace edema at distal lateral ankles B  Lab Results  Component Value Date   WBC 10.6 (H) 05/30/2016   HGB 12.6 05/30/2016   HCT 37.8 05/30/2016   PLT 218 05/30/2016   GLUCOSE 115 (H) 05/30/2016   CHOL 231 (H) 03/13/2013   TRIG 117.0 03/13/2013   HDL 80.80 03/13/2013   LDLDIRECT 113.0 03/13/2013   ALT 23 05/30/2016   AST 24 05/30/2016   NA 136 05/30/2016   K 3.9 05/30/2016   CL 105 05/30/2016    CREATININE 0.70 05/30/2016   BUN 16 05/30/2016   CO2 23 05/30/2016   TSH 0.56 03/09/2016   INR 1.03 05/30/2016    No results found.  Assessment & Plan:   There are no diagnoses linked to this encounter. I have discontinued Ms. Landry's aspirin EC and predniSONE. I am also having her maintain her Magnesium, b complex vitamins, oxyCODONE, POTASSIUM PO, and diphenhydrAMINE.  No orders of the defined types were placed in this encounter.    Follow-up: No Follow-up on file.  Walker Kehr, MD

## 2017-09-07 NOTE — Assessment & Plan Note (Signed)
Chronic. 

## 2017-09-07 NOTE — Assessment & Plan Note (Addendum)
Here for medicare wellness/physical  Diet: heart healthy  Physical activity: not sedentary  Depression/mood screen: negative  Hearing: intact to whispered voice  Visual acuity: grossly normal, performs annual eye exam  ADLs: capable  Fall risk: low to none  Home safety: good  Cognitive evaluation: intact to orientation, naming, recall and repetition  EOL planning: adv directives, full code/ I agree  I have personally reviewed and have noted  1. The patient's medical, surgical and social history  2. Their use of alcohol, tobacco or illicit drugs  3. Their current medications and supplements  4. The patient's functional ability including ADL's, fall risks, home safety risks and hearing or visual impairment.  5. Diet and physical activities  6. Evidence for depression or mood disorders 7. The roster of all physicians providing medical care to patient - is listed in the Snapshot section of the chart and reviewed today.    Today patient counseled on age appropriate routine health concerns for screening and prevention, each reviewed and up to date or declined. Immunizations reviewed and up to date or declined. Labs ordered and reviewed. Risk factors for depression reviewed and negative. Hearing function and visual acuity are intact. ADLs screened and addressed as needed. Functional ability and level of safety reviewed and appropriate. Education, counseling and referrals performed based on assessed risks today. Patient provided with a copy of personalized plan for preventive services.   Pt refused all shots, colon ca screening  Labs

## 2017-09-07 NOTE — Assessment & Plan Note (Addendum)
Valerian root 

## 2017-09-14 DIAGNOSIS — L309 Dermatitis, unspecified: Secondary | ICD-10-CM | POA: Diagnosis not present

## 2017-11-12 ENCOUNTER — Encounter: Payer: Self-pay | Admitting: Internal Medicine

## 2017-11-12 ENCOUNTER — Ambulatory Visit: Payer: Medicare HMO | Admitting: Internal Medicine

## 2017-11-12 DIAGNOSIS — J209 Acute bronchitis, unspecified: Secondary | ICD-10-CM

## 2017-11-12 MED ORDER — PROMETHAZINE-CODEINE 6.25-10 MG/5ML PO SYRP: 5.0000 mL | ORAL_SOLUTION | ORAL | 0 refills | Status: DC | PRN

## 2017-11-12 MED ORDER — AMOXICILLIN-POT CLAVULANATE 875-125 MG PO TABS: 1.0000 | ORAL_TABLET | Freq: Two times a day (BID) | ORAL | 0 refills | Status: DC

## 2017-11-12 NOTE — Patient Instructions (Signed)
You can use over-the-counter  "cold" medicines  such as "Tylenol cold" , "Advil cold",  "Mucinex" or" Mucinex D"  for cough and congestion.   Avoid decongestants if you have high blood pressure and use "Afrin" nasal spray for nasal congestion as directed. Use " Delsym" or" Robitussin" cough syrup varietis for cough.  You can use plain "Tylenol" or "Advil" for fever, chills and achyness. Use Halls or Ricola cough drops.   Please, make an appointment if you are not better or if you're worse.  

## 2017-11-12 NOTE — Assessment & Plan Note (Signed)
Augmentin po Prom-cod syr

## 2017-11-12 NOTE — Progress Notes (Signed)
Subjective:  Patient ID: Stacy Robinson, female    DOB: [birth year]  Age: 71 y.o. MRN: [id number]  CC: No chief complaint on file.   HPI Stacy Robinson presents for URI sx's x 3 weeks. C/o cough - ?productive  Outpatient Medications Prior to Visit  Medication Sig Dispense Refill  . b complex vitamins tablet Take 1 tablet by mouth daily.    . cholecalciferol (VITAMIN D) 1000 units tablet Take 1 tablet (1,000 Units total) by mouth daily. 30 tablet 11  . diphenhydrAMINE (BENADRYL) 25 MG tablet Take 1-2 tablets (25-50 mg total) by mouth every 4 (four) hours as needed for itching or allergies. 100 tablet 3  . Magnesium 100 MG TABS Take 100 mg by mouth daily.     [name] oxyCODONE (ROXICODONE) 5 MG immediate release tablet Take 1-2 tablets (5-10 mg total) by mouth every 4 (four) hours as needed for severe pain. 30 tablet 0  . POTASSIUM PO Take 1 tablet by mouth daily. "OTC"     No facility-administered medications prior to visit.     ROS Review of Systems  Constitutional: Positive for chills. Negative for activity change, appetite change, fatigue and unexpected weight change.  HENT: Positive for congestion, postnasal drip and sore throat. Negative for mouth sores and sinus pressure.   Eyes: Negative for visual disturbance.  Respiratory: Positive for cough and shortness of breath. Negative for chest tightness.   Gastrointestinal: Negative for abdominal pain and nausea.  Genitourinary: Negative for difficulty urinating, frequency and vaginal pain.  Musculoskeletal: Negative for back pain and gait problem.  Skin: Negative for pallor and rash.  Neurological: Negative for dizziness, tremors, weakness, numbness and headaches.  Psychiatric/Behavioral: Negative for confusion and sleep disturbance.    Objective:  BP 136/82 (BP Location: Right Arm, Patient Position: Sitting, Cuff Size: Normal)   Pulse 87   Temp 98.6 F (37 C) (Oral)   Ht 5\' 8"  (1.727 m)   Wt 161 lb (73 kg)   SpO2 98%   BMI  24.48 kg/m   BP Readings from Last 3 Encounters:  11/12/17 136/82  09/07/17 98/68  06/19/16 94/66    Wt Readings from Last 3 Encounters:  11/12/17 161 lb (73 kg)  09/07/17 164 lb (74.4 kg)  06/26/16 165 lb (74.8 kg)    Physical Exam  Constitutional: She appears well-developed. No distress.  HENT:  Head: Normocephalic.  Right Ear: External ear normal.  Left Ear: External ear normal.  Nose: Nose normal.  Mouth/Throat: Oropharynx is clear and moist.  Eyes: Conjunctivae are normal. Pupils are equal, round, and reactive to light. Right eye exhibits no discharge. Left eye exhibits no discharge.  Neck: Normal range of motion. Neck supple. No JVD present. No tracheal deviation present. No thyromegaly present.  Cardiovascular: Normal rate, regular rhythm and normal heart sounds.  Pulmonary/Chest: No stridor. No respiratory distress. She has no wheezes.  Abdominal: Soft. Bowel sounds are normal. She exhibits no distension and no mass. There is no tenderness. There is no rebound and no guarding.  Musculoskeletal: She exhibits no edema or tenderness.  Lymphadenopathy:    She has no cervical adenopathy.  Neurological: She displays normal reflexes. No cranial nerve deficit. She exhibits normal muscle tone. Coordination normal.  Skin: No rash noted. No erythema.  Psychiatric: She has a normal mood and affect. Her behavior is normal. Judgment and thought content normal.    Lab Results  Component Value Date   WBC 7.9 09/07/2017   HGB 14.7 09/07/2017  HCT 42.0 09/07/2017   PLT 209.0 09/07/2017   GLUCOSE 103 (H) 09/07/2017   CHOL 215 (H) 09/07/2017   TRIG 115.0 09/07/2017   HDL 71.40 09/07/2017   LDLDIRECT 113.0 03/13/2013   LDLCALC 121 (H) 09/07/2017   ALT 17 09/07/2017   AST 20 09/07/2017   NA 139 09/07/2017   K 4.1 09/07/2017   CL 104 09/07/2017   CREATININE 0.81 09/07/2017   BUN 13 09/07/2017   CO2 27 09/07/2017   TSH 0.78 09/07/2017   INR 1.03 05/30/2016    No results  found.  Assessment & Plan:   There are no diagnoses linked to this encounter. I am having Marie V. Csaszar maintain her Magnesium, b complex vitamins, oxyCODONE, POTASSIUM PO, diphenhydrAMINE, and cholecalciferol.  No orders of the defined types were placed in this encounter.    Follow-up: No Follow-up on file.  Walker Kehr, MD

## 2017-11-29 ENCOUNTER — Telehealth: Payer: Self-pay | Admitting: Internal Medicine

## 2017-11-29 DIAGNOSIS — R059 Cough, unspecified: Secondary | ICD-10-CM

## 2017-11-29 DIAGNOSIS — R05 Cough: Secondary | ICD-10-CM

## 2017-11-29 NOTE — Telephone Encounter (Signed)
Copied from [place]. Topic: Inquiry >> Nov 29, 2017 12:46 PM Conception Chancy, NT wrote: Reason for CRM: patient states the cough is back, and she would like to talk with betty about what to do next.   Please advise?

## 2017-12-01 NOTE — Telephone Encounter (Signed)
LMTCB

## 2017-12-02 NOTE — Telephone Encounter (Signed)
Patient states she still has a cough but it is not longer productive and is now a dry cough and it is causing her to have body aches.  Please advise

## 2017-12-02 NOTE — Telephone Encounter (Signed)
Copied from [place]. Topic: Inquiry >> Nov 29, 2017 12:46 PM Conception Chancy, NT wrote: Reason for CRM: patient states the cough is back, and she would like to talk with betty about what to do next.

## 2017-12-03 NOTE — Telephone Encounter (Signed)
LM notifying pt

## 2017-12-03 NOTE — Telephone Encounter (Signed)
CXR  You can use over-the-counter Use " Delsym" or" Robitussin" cough syrup varietis for cough. Use Halls or Ricola cough drops.    Please, make an appointment if you are not better or if you're worse. Thx

## 2018-05-02 ENCOUNTER — Other Ambulatory Visit (INDEPENDENT_AMBULATORY_CARE_PROVIDER_SITE_OTHER): Payer: Medicare HMO

## 2018-05-02 ENCOUNTER — Encounter: Payer: Self-pay | Admitting: Internal Medicine

## 2018-05-02 ENCOUNTER — Ambulatory Visit: Payer: Medicare HMO | Admitting: Internal Medicine

## 2018-05-02 DIAGNOSIS — R011 Cardiac murmur, unspecified: Secondary | ICD-10-CM | POA: Insufficient documentation

## 2018-05-02 DIAGNOSIS — R609 Edema, unspecified: Secondary | ICD-10-CM | POA: Insufficient documentation

## 2018-05-02 LAB — URINALYSIS, ROUTINE W REFLEX MICROSCOPIC
BILIRUBIN URINE: NEGATIVE
Ketones, ur: NEGATIVE
LEUKOCYTES UA: NEGATIVE
NITRITE: NEGATIVE
Specific Gravity, Urine: 1.02 (ref 1.000–1.030)
TOTAL PROTEIN, URINE-UPE24: NEGATIVE
Urine Glucose: NEGATIVE
Urobilinogen, UA: 0.2 (ref 0.0–1.0)
pH: 5.5 (ref 5.0–8.0)

## 2018-05-02 LAB — CBC WITH DIFFERENTIAL/PLATELET
BASOS ABS: 0 10*3/uL (ref 0.0–0.1)
Basophils Relative: 0.9 % (ref 0.0–3.0)
EOS PCT: 0.4 % (ref 0.0–5.0)
Eosinophils Absolute: 0 10*3/uL (ref 0.0–0.7)
HEMATOCRIT: 42.1 % (ref 36.0–46.0)
Hemoglobin: 14.9 g/dL (ref 12.0–15.0)
LYMPHS PCT: 43.4 % (ref 12.0–46.0)
Lymphs Abs: 2.3 10*3/uL (ref 0.7–4.0)
MCHC: 35.3 g/dL (ref 30.0–36.0)
MCV: 94.1 fl (ref 78.0–100.0)
MONOS PCT: 9.2 % (ref 3.0–12.0)
Monocytes Absolute: 0.5 10*3/uL (ref 0.1–1.0)
NEUTROS ABS: 2.4 10*3/uL (ref 1.4–7.7)
Neutrophils Relative %: 46.1 % (ref 43.0–77.0)
PLATELETS: 217 10*3/uL (ref 150.0–400.0)
RBC: 4.48 Mil/uL (ref 3.87–5.11)
RDW: 13.5 % (ref 11.5–15.5)
WBC: 5.3 10*3/uL (ref 4.0–10.5)

## 2018-05-02 LAB — HEPATIC FUNCTION PANEL
ALT: 23 U/L (ref 0–35)
AST: 28 U/L (ref 0–37)
Albumin: 4 g/dL (ref 3.5–5.2)
Alkaline Phosphatase: 65 U/L (ref 39–117)
BILIRUBIN DIRECT: 0.1 mg/dL (ref 0.0–0.3)
BILIRUBIN TOTAL: 0.4 mg/dL (ref 0.2–1.2)
Total Protein: 7.2 g/dL (ref 6.0–8.3)

## 2018-05-02 LAB — BASIC METABOLIC PANEL
BUN: 15 mg/dL (ref 6–23)
CALCIUM: 9.3 mg/dL (ref 8.4–10.5)
CO2: 25 meq/L (ref 19–32)
Chloride: 104 mEq/L (ref 96–112)
Creatinine, Ser: 0.81 mg/dL (ref 0.40–1.20)
GFR: 73.46 mL/min (ref >60.00)
Glucose, Bld: 84 mg/dL (ref 70–99)
Potassium: 4.3 mEq/L (ref 3.5–5.1)
Sodium: 138 mEq/L (ref 135–145)

## 2018-05-02 LAB — TSH: TSH: 0.92 u[IU]/mL (ref 0.35–4.50)

## 2018-05-02 MED ORDER — POTASSIUM CHLORIDE ER 8 MEQ PO TBCR: 8.0000 meq | EXTENDED_RELEASE_TABLET | Freq: Every day | ORAL | 11 refills | Status: DC

## 2018-05-02 MED ORDER — HYDROCHLOROTHIAZIDE 12.5 MG PO CAPS: 12.5000 mg | ORAL_CAPSULE | Freq: Every day | ORAL | 11 refills | Status: DC

## 2018-05-02 NOTE — Assessment & Plan Note (Signed)
ECHO Labs HCTZ/Klor-con

## 2018-05-02 NOTE — Assessment & Plan Note (Signed)
ECHO Labs

## 2018-05-02 NOTE — Progress Notes (Signed)
Subjective:  Patient ID: Stacy Robinson, female    DOB: [birth year]  Age: 72 y.o. MRN: [id number]  CC: No chief complaint on file.   HPI Stacy Robinson presents for leg swelling off and on C/o "pink fingernails"   Outpatient Medications Prior to Visit  Medication Sig Dispense Refill  . b complex vitamins tablet Take 1 tablet by mouth daily.    . cholecalciferol (VITAMIN D) 1000 units tablet Take 1 tablet (1,000 Units total) by mouth daily. 30 tablet 11  . diphenhydrAMINE (BENADRYL) 25 MG tablet Take 1-2 tablets (25-50 mg total) by mouth every 4 (four) hours as needed for itching or allergies. 100 tablet 3  . Magnesium 100 MG TABS Take 100 mg by mouth daily.     [name] POTASSIUM PO Take 1 tablet by mouth daily. "OTC"    . amoxicillin-clavulanate (AUGMENTIN) 875-125 MG tablet Take 1 tablet 2 (two) times daily by mouth. (Patient not taking: Reported on 05/02/2018) 20 tablet 0  . oxyCODONE (ROXICODONE) 5 MG immediate release tablet Take 1-2 tablets (5-10 mg total) by mouth every 4 (four) hours as needed for severe pain. 30 tablet 0  . promethazine-codeine (PHENERGAN WITH CODEINE) 6.25-10 MG/5ML syrup Take 5 mLs every 4 (four) hours as needed by mouth. (Patient not taking: Reported on 05/02/2018) 300 mL 0   No facility-administered medications prior to visit.     ROS Review of Systems  Constitutional: Negative for activity change, appetite change, chills, fatigue and unexpected weight change.  HENT: Negative for congestion, mouth sores and sinus pressure.   Eyes: Negative for visual disturbance.  Respiratory: Negative for cough and chest tightness.   Cardiovascular: Positive for leg swelling.  Gastrointestinal: Negative for abdominal pain and nausea.  Genitourinary: Negative for difficulty urinating, frequency and vaginal pain.  Musculoskeletal: Negative for back pain and gait problem.  Skin: Negative for pallor and rash.  Neurological: Negative for dizziness, tremors, weakness, numbness and  headaches.  Psychiatric/Behavioral: Negative for confusion, sleep disturbance and suicidal ideas.    Objective:  BP 126/74 (BP Location: Left Arm, Patient Position: Sitting, Cuff Size: Normal)   Pulse 76   Temp 98.2 F (36.8 C) (Oral)   Ht 5\' 8"  (1.727 m)   Wt 168 lb (76.2 kg)   SpO2 98%   BMI 25.54 kg/m   BP Readings from Last 3 Encounters:  05/02/18 126/74  11/12/17 136/82  09/07/17 98/68    Wt Readings from Last 3 Encounters:  05/02/18 168 lb (76.2 kg)  11/12/17 161 lb (73 kg)  09/07/17 164 lb (74.4 kg)    Physical Exam  Constitutional: She appears well-developed. No distress.  HENT:  Head: Normocephalic.  Right Ear: External ear normal.  Left Ear: External ear normal.  Nose: Nose normal.  Mouth/Throat: Oropharynx is clear and moist.  Eyes: Pupils are equal, round, and reactive to light. Conjunctivae are normal. Right eye exhibits no discharge. Left eye exhibits no discharge.  Neck: Normal range of motion. Neck supple. No JVD present. No tracheal deviation present. No thyromegaly present.  Cardiovascular: Normal rate and regular rhythm.  Pulmonary/Chest: No stridor. No respiratory distress. She has no wheezes.  Abdominal: Soft. Bowel sounds are normal. She exhibits no distension and no mass. There is no tenderness. There is no rebound and no guarding.  Musculoskeletal: She exhibits no edema or tenderness.  Lymphadenopathy:    She has no cervical adenopathy.  Neurological: She displays normal reflexes. No cranial nerve deficit. She exhibits normal muscle tone. Coordination normal.  Skin: No rash noted. No erythema.  Psychiatric: She has a normal mood and affect. Her behavior is normal. Judgment and thought content normal.   Heart murmur 1-2/6 Trace edema   Lab Results  Component Value Date   WBC 7.9 09/07/2017   HGB 14.7 09/07/2017   HCT 42.0 09/07/2017   PLT 209.0 09/07/2017   GLUCOSE 103 (H) 09/07/2017   CHOL 215 (H) 09/07/2017   TRIG 115.0 09/07/2017    HDL 71.40 09/07/2017   LDLDIRECT 113.0 03/13/2013   LDLCALC 121 (H) 09/07/2017   ALT 17 09/07/2017   AST 20 09/07/2017   NA 139 09/07/2017   K 4.1 09/07/2017   CL 104 09/07/2017   CREATININE 0.81 09/07/2017   BUN 13 09/07/2017   CO2 27 09/07/2017   TSH 0.78 09/07/2017   INR 1.03 05/30/2016    No results found.  Assessment & Plan:   There are no diagnoses linked to this encounter. I have discontinued Salote V. Tigert's oxyCODONE, amoxicillin-clavulanate, and promethazine-codeine. I am also having her maintain her Magnesium, b complex vitamins, POTASSIUM PO, diphenhydrAMINE, and cholecalciferol.  No orders of the defined types were placed in this encounter.    Follow-up: No follow-ups on file.  Walker Kehr, MD

## 2018-05-03 ENCOUNTER — Other Ambulatory Visit: Payer: Self-pay | Admitting: Internal Medicine

## 2018-05-03 DIAGNOSIS — R3129 Other microscopic hematuria: Secondary | ICD-10-CM

## 2018-05-03 NOTE — Addendum Note (Signed)
Addended by: Karren Cobble on: 05/03/2018 02:07 PM   Modules accepted: Orders

## 2018-05-11 ENCOUNTER — Telehealth: Payer: Self-pay | Admitting: Internal Medicine

## 2018-05-11 NOTE — Telephone Encounter (Signed)
Copied from [place] [id number]. Topic: Quick Communication - See Telephone Encounter >> May 11, 2018 11:25 AM Arletha Grippe wrote: CRM for notification. See Telephone encounter for: 05/11/18. Pt requesting call back from [place] about echo that was done a few years ago. Please call [phone]

## 2018-05-12 ENCOUNTER — Ambulatory Visit
Admission: RE | Admit: 2018-05-12 | Discharge: 2018-05-12 | Disposition: A | Discharge status: Home / Self Care | Payer: Medicare HMO | Source: Ambulatory Visit | Attending: Internal Medicine | Admitting: Internal Medicine

## 2018-05-12 DIAGNOSIS — R3129 Other microscopic hematuria: Secondary | ICD-10-CM

## 2018-05-12 DIAGNOSIS — N281 Cyst of kidney, acquired: Secondary | ICD-10-CM | POA: Diagnosis not present

## 2018-05-12 NOTE — Telephone Encounter (Signed)
LMTCB

## 2018-05-16 NOTE — Telephone Encounter (Signed)
Patient is returning [place] phone call. Please contact  [phone]

## 2018-05-17 NOTE — Telephone Encounter (Signed)
Pt was asking about EKG done in hospital

## 2018-05-18 ENCOUNTER — Encounter: Payer: Self-pay | Admitting: Cardiology

## 2018-06-13 ENCOUNTER — Ambulatory Visit (INDEPENDENT_AMBULATORY_CARE_PROVIDER_SITE_OTHER): Payer: Medicare HMO | Admitting: Internal Medicine

## 2018-06-13 ENCOUNTER — Encounter: Payer: Self-pay | Admitting: Internal Medicine

## 2018-06-13 DIAGNOSIS — F419 Anxiety disorder, unspecified: Secondary | ICD-10-CM

## 2018-06-13 DIAGNOSIS — R609 Edema, unspecified: Secondary | ICD-10-CM | POA: Diagnosis not present

## 2018-06-13 MED ORDER — ALPRAZOLAM 0.25 MG PO TABS: 0.2500 mg | ORAL_TABLET | Freq: Two times a day (BID) | ORAL | 1 refills | Status: DC | PRN

## 2018-06-13 NOTE — Assessment & Plan Note (Signed)
Renal US was ok Has not had an ECHO yet. No CP, no SOB Lasix prn

## 2018-06-13 NOTE — Assessment & Plan Note (Signed)
Xanax prn  Potential benefits of a long term benzodiazepines  use as well as potential risks  and complications were explained to the patient and were aknowledged. 

## 2018-06-13 NOTE — Progress Notes (Signed)
Subjective:  Patient ID: Stacy Robinson, female    DOB: 12-Oct-1946  Age: 72 y.o. MRN: [id number]  CC: No chief complaint on file.   HPI Stacy Robinson presents for leg swelling. Has not had an ECHO yet. No CP, no SOB  Outpatient Medications Prior to Visit  Medication Sig Dispense Refill  . b complex vitamins tablet Take 1 tablet by mouth daily.    . cholecalciferol (VITAMIN D) 1000 units tablet Take 1 tablet (1,000 Units total) by mouth daily. 30 tablet 11  . diphenhydrAMINE (BENADRYL) 25 MG tablet Take 1-2 tablets (25-50 mg total) by mouth every 4 (four) hours as needed for itching or allergies. 100 tablet 3  . hydrochlorothiazide (MICROZIDE) 12.5 MG capsule Take 1 capsule (12.5 mg total) by mouth daily. 30 capsule 11  . Magnesium 100 MG TABS Take 100 mg by mouth daily.     . potassium chloride (KLOR-CON) 8 MEQ tablet Take 1 tablet (8 mEq total) by mouth daily. 30 tablet 11  . POTASSIUM PO Take 1 tablet by mouth daily. "OTC"     No facility-administered medications prior to visit.     ROS: Review of Systems  Constitutional: Negative for activity change, appetite change, chills, fatigue and unexpected weight change.  HENT: Negative for congestion, mouth sores and sinus pressure.   Eyes: Negative for visual disturbance.  Respiratory: Negative for cough and chest tightness.   Cardiovascular: Positive for leg swelling.  Gastrointestinal: Negative for abdominal pain and nausea.  Genitourinary: Negative for difficulty urinating, frequency and vaginal pain.  Musculoskeletal: Negative for back pain and gait problem.  Skin: Negative for pallor and rash.  Neurological: Negative for dizziness, tremors, weakness, numbness and headaches.  Psychiatric/Behavioral: Negative for confusion, sleep disturbance and suicidal ideas.    Objective:  BP 122/74 (BP Location: Left Arm, Patient Position: Sitting, Cuff Size: Normal)   Pulse 72   Temp 98.4 F (36.9 C) (Oral)   Ht 5\' 8"  (1.727 m)    Wt 172 lb (78 kg)   SpO2 98%   BMI 26.15 kg/m   BP Readings from Last 3 Encounters:  06/13/18 122/74  05/02/18 126/74  11/12/17 136/82    Wt Readings from Last 3 Encounters:  06/13/18 172 lb (78 kg)  05/02/18 168 lb (76.2 kg)  11/12/17 161 lb (73 kg)    Physical Exam  Constitutional: She appears well-developed. No distress.  HENT:  Head: Normocephalic.  Right Ear: External ear normal.  Left Ear: External ear normal.  Nose: Nose normal.  Mouth/Throat: Oropharynx is clear and moist.  Eyes: Pupils are equal, round, and reactive to light. Conjunctivae are normal. Right eye exhibits no discharge. Left eye exhibits no discharge.  Neck: Normal range of motion. Neck supple. No JVD present. No tracheal deviation present. No thyromegaly present.  Cardiovascular: Normal rate, regular rhythm and normal heart sounds.  Pulmonary/Chest: No stridor. No respiratory distress. She has no wheezes.  Abdominal: Soft. Bowel sounds are normal. She exhibits no distension and no mass. There is no tenderness. There is no rebound and no guarding.  Musculoskeletal: She exhibits no edema or tenderness.  Lymphadenopathy:    She has no cervical adenopathy.  Neurological: She displays normal reflexes. No cranial nerve deficit. She exhibits normal muscle tone. Coordination normal.  Skin: No rash noted. No erythema.  Psychiatric: She has a normal mood and affect. Her behavior is normal. Judgment and thought content normal.  no edema  Lab Results  Component Value Date   WBC  5.3 05/02/2018   HGB 14.9 05/02/2018   HCT 42.1 05/02/2018   PLT 217.0 05/02/2018   GLUCOSE 84 05/02/2018   CHOL 215 (H) 09/07/2017   TRIG 115.0 09/07/2017   HDL 71.40 09/07/2017   LDLDIRECT 113.0 03/13/2013   LDLCALC 121 (H) 09/07/2017   ALT 23 05/02/2018   AST 28 05/02/2018   NA 138 05/02/2018   K 4.3 05/02/2018   CL 104 05/02/2018   CREATININE 0.81 05/02/2018   BUN 15 05/02/2018   CO2 25 05/02/2018   TSH 0.92 05/02/2018     INR 1.03 05/30/2016    US Renal  Result Date: 05/12/2018 CLINICAL DATA:  Microhematuria. EXAM: RENAL / URINARY TRACT ULTRASOUND COMPLETE COMPARISON:  Body CT 05/28/2015 FINDINGS: Right Kidney: Length: 9.7 cm. Echogenicity within normal limits. No solid mass or hydronephrosis visualized. 8 mm benign-appearing exophytic cyst off of the mid polar cortex of the right kidney. Left Kidney: Length: 10.3 cm. Echogenicity within normal limits. No solid mass or hydronephrosis visualized. 1.8 cm benign-appearing cyst off of the upper pole of the right kidney. Bladder: Appears normal for degree of bladder distention. IMPRESSION: Bilateral renal cysts, otherwise unremarkable renal ultrasound. Electronically Signed   By: Fidela Salisbury M.D.   On: 05/12/2018 22:08    Assessment & Plan:   There are no diagnoses linked to this encounter.   No orders of the defined types were placed in this encounter.    Follow-up: No follow-ups on file.  Walker Kehr, MD

## 2018-06-20 DIAGNOSIS — D229 Melanocytic nevi, unspecified: Secondary | ICD-10-CM | POA: Diagnosis not present

## 2018-06-20 DIAGNOSIS — C4491 Basal cell carcinoma of skin, unspecified: Secondary | ICD-10-CM | POA: Diagnosis not present

## 2018-06-20 DIAGNOSIS — L821 Other seborrheic keratosis: Secondary | ICD-10-CM | POA: Diagnosis not present

## 2018-06-21 ENCOUNTER — Telehealth: Payer: Self-pay | Admitting: Internal Medicine

## 2018-06-21 NOTE — Telephone Encounter (Signed)
Spoke with cardiology. They called pt and left msg for her to call them back

## 2018-06-21 NOTE — Telephone Encounter (Signed)
Copied from [place] [id number]. Topic: General - Other >> Jun 21, 2018  1:26 PM Vernona Rieger wrote: Reason for CRM: Patient said she has tried several times to call and set up her echo with the heart care office and can not get through. She wants to know if someone at the office can call and make the appointment and call her with the appointment. Call back [phone]

## 2018-07-05 NOTE — Telephone Encounter (Signed)
She thought the echo was just be order, would like to know why she is seeing provider?

## 2018-07-06 NOTE — Telephone Encounter (Signed)
Pt stated she canceled her appt to have echo done.

## 2018-07-08 ENCOUNTER — Ambulatory Visit: Payer: Medicare HMO | Admitting: Cardiovascular Disease

## 2018-07-15 DIAGNOSIS — M9902 Segmental and somatic dysfunction of thoracic region: Secondary | ICD-10-CM | POA: Diagnosis not present

## 2018-07-15 DIAGNOSIS — M545 Low back pain: Secondary | ICD-10-CM | POA: Diagnosis not present

## 2018-07-15 DIAGNOSIS — M9903 Segmental and somatic dysfunction of lumbar region: Secondary | ICD-10-CM | POA: Diagnosis not present

## 2018-07-15 DIAGNOSIS — M546 Pain in thoracic spine: Secondary | ICD-10-CM | POA: Diagnosis not present

## 2018-07-20 DIAGNOSIS — M546 Pain in thoracic spine: Secondary | ICD-10-CM | POA: Diagnosis not present

## 2018-07-20 DIAGNOSIS — M545 Low back pain: Secondary | ICD-10-CM | POA: Diagnosis not present

## 2018-07-20 DIAGNOSIS — M9902 Segmental and somatic dysfunction of thoracic region: Secondary | ICD-10-CM | POA: Diagnosis not present

## 2018-07-20 DIAGNOSIS — M9903 Segmental and somatic dysfunction of lumbar region: Secondary | ICD-10-CM | POA: Diagnosis not present

## 2018-09-01 DIAGNOSIS — H35372 Puckering of macula, left eye: Secondary | ICD-10-CM | POA: Diagnosis not present

## 2018-09-01 DIAGNOSIS — H524 Presbyopia: Secondary | ICD-10-CM | POA: Diagnosis not present

## 2018-09-01 DIAGNOSIS — H2511 Age-related nuclear cataract, right eye: Secondary | ICD-10-CM | POA: Diagnosis not present

## 2018-09-01 DIAGNOSIS — Z961 Presence of intraocular lens: Secondary | ICD-10-CM | POA: Diagnosis not present

## 2018-09-13 ENCOUNTER — Ambulatory Visit: Payer: Medicare HMO | Admitting: Cardiovascular Disease

## 2018-09-13 ENCOUNTER — Encounter: Payer: Self-pay | Admitting: Cardiovascular Disease

## 2018-09-13 VITALS — BP 108/74 | HR 72 | Ht 68.0 in | Wt 177.0 lb

## 2018-09-13 DIAGNOSIS — I208 Other forms of angina pectoris: Secondary | ICD-10-CM

## 2018-09-13 NOTE — Patient Instructions (Signed)
Medication Instructions:  Your physician recommends that you continue on your current medications as directed. Please refer to the Current Medication list given to you today.   Labwork: none  Testing/Procedures: none  Follow-Up: Follow up with Dr. Berry as needed.   Any Other Special Instructions Will Be Listed Below (If Applicable).     If you need a refill on your cardiac medications before your next appointment, please call your pharmacy.   

## 2018-09-13 NOTE — Assessment & Plan Note (Signed)
History of hyperlipidemia not on statin therapy LDL performed 09/07/2017 of 121.  She does admit to dietary indiscretion.

## 2018-09-13 NOTE — Progress Notes (Signed)
09/13/2018 Tesslyn Baumert [place]   1946-11-16  [id number]  Primary Physician Plotnikov, Evie Lacks, MD Primary Cardiologist: Lorretta Harp MD Lupe Carney, [place]  HPI:  Stacy Robinson is a 72 y.o.  thin-appearing widowed Caucasian female mother of 2 children, grandmother of one grandchild referred by Dr. Mignon Pine cough for cardiovascular evaluation because of new onset chest pain.  I last saw her in the office 06/19/2016.  Her risk factors are only notable for mild hyperlipidemia and remote tobacco abuse. She's never had a heart attack or stroke. She had new onset chest pain with rotation to her jaw and left upper extremity 2 weeks ago. This recurred one week later. She did have a CT angiogram which ruled out pulmonary embolus but did show aortic atherosclerotic changes I performed a Myoview stress test on her 06/26/2016 which was low risk and nonischemic.  She continues to have atypical somewhat positional/musculoskeletal chest pain.  Current Meds  Medication Sig  . ALPRAZolam (XANAX) 0.25 MG tablet Take 1-2 tablets (0.25-0.5 mg total) by mouth 2 (two) times daily as needed for anxiety or sleep.  [name] b complex vitamins tablet Take 1 tablet by mouth daily.  . diphenhydrAMINE (BENADRYL) 25 MG tablet Take 1-2 tablets (25-50 mg total) by mouth every 4 (four) hours as needed for itching or allergies.  . hydrochlorothiazide (MICROZIDE) 12.5 MG capsule Take 1 capsule (12.5 mg total) by mouth daily.  . Magnesium 100 MG TABS Take 100 mg by mouth daily.   . potassium chloride (KLOR-CON) 8 MEQ tablet Take 1 tablet (8 mEq total) by mouth daily.  [name] POTASSIUM PO Take 1 tablet by mouth daily. "OTC"     Allergies  Allergen Reactions  . Metronidazole Nausea Only and Shortness Of Breath    Muscle aches  . Vigamox [Moxifloxacin] Nausea And Vomiting and Other (See Comments)    Could not walk.  . Ciprofloxacin     spasms  . Sodium Sulfate   . Sulfamethoxazole Cough    Social History   Socioeconomic  History  . Marital status: Widowed    Spouse name: Not on file  . Number of children: Not on file  . Years of education: Not on file  . Highest education level: Not on file  Occupational History  . Not on file  Social Needs  . Financial resource strain: Not on file  . Food insecurity:    Worry: Not on file    Inability: Not on file  . Transportation needs:    Medical: Not on file    Non-medical: Not on file  Tobacco Use  . Smoking status: Former Research scientist (life sciences)  . Smokeless tobacco: Never Used  Substance and Sexual Activity  . Alcohol use: Yes    Alcohol/week: 0.0 standard drinks  . Drug use: No  . Sexual activity: Yes  Lifestyle  . Physical activity:    Days per week: Not on file    Minutes per session: Not on file  . Stress: Not on file  Relationships  . Social connections:    Talks on phone: Not on file    Gets together: Not on file    Attends religious service: Not on file    Active member of club or organization: Not on file    Attends meetings of clubs or organizations: Not on file    Relationship status: Not on file  . Intimate partner violence:    Fear of current or ex partner: Not on file  Emotionally abused: Not on file    Physically abused: Not on file    Forced sexual activity: Not on file  Other Topics Concern  . Not on file  Social History Narrative  . Not on file     Review of Systems: General: negative for chills, fever, night sweats or weight changes.  Cardiovascular: negative for chest pain, dyspnea on exertion, edema, orthopnea, palpitations, paroxysmal nocturnal dyspnea or shortness of breath Dermatological: negative for rash Respiratory: negative for cough or wheezing Urologic: negative for hematuria Abdominal: negative for nausea, vomiting, diarrhea, bright red blood per rectum, melena, or hematemesis Neurologic: negative for visual changes, syncope, or dizziness All other systems reviewed and are otherwise negative except as noted  above.    Blood pressure 108/74, pulse 72, height 5\' 8"  (1.727 m), weight 177 lb (80.3 kg).  General appearance: alert and no distress Neck: no adenopathy, no carotid bruit, no JVD, supple, symmetrical, trachea midline and thyroid not enlarged, symmetric, no tenderness/mass/nodules Lungs: clear to auscultation bilaterally Heart: regular rate and rhythm, S1, S2 normal, no murmur, click, rub or gallop Extremities: extremities normal, atraumatic, no cyanosis or edema Pulses: 2+ and symmetric Skin: Skin color, texture, turgor normal. No rashes or lesions Neurologic: Alert and oriented X 3, normal strength and tone. Normal symmetric reflexes. Normal coordination and gait  EKG sinus rhythm at 72 without ST or T wave changes.  Personally reviewed this EKG  ASSESSMENT AND PLAN:   HYPERLIPIDEMIA History of hyperlipidemia not on statin therapy LDL performed 09/07/2017 of 121.  She does admit to dietary indiscretion.  CHEST PAIN History of atypical chest pain with a negative Myoview 05/3029/17.  Her pain characteristics and frequency and severity have not changed since I saw her 2 years ago.      Lorretta Harp MD FACP,FACC,FAHA, [facility] 09/13/2018 2:22 PM

## 2018-09-13 NOTE — Assessment & Plan Note (Signed)
History of atypical chest pain with a negative Myoview 05/3029/17.  Her pain characteristics and frequency and severity have not changed since I saw her 2 years ago.

## 2018-10-04 DIAGNOSIS — H25011 Cortical age-related cataract, right eye: Secondary | ICD-10-CM | POA: Diagnosis not present

## 2018-10-04 DIAGNOSIS — H25811 Combined forms of age-related cataract, right eye: Secondary | ICD-10-CM | POA: Diagnosis not present

## 2018-10-04 DIAGNOSIS — H2511 Age-related nuclear cataract, right eye: Secondary | ICD-10-CM | POA: Diagnosis not present

## 2018-11-29 DIAGNOSIS — R05 Cough: Secondary | ICD-10-CM | POA: Diagnosis not present

## 2018-11-29 DIAGNOSIS — R7989 Other specified abnormal findings of blood chemistry: Secondary | ICD-10-CM | POA: Diagnosis not present

## 2018-11-29 DIAGNOSIS — J40 Bronchitis, not specified as acute or chronic: Secondary | ICD-10-CM | POA: Diagnosis not present

## 2018-11-29 DIAGNOSIS — R5383 Other fatigue: Secondary | ICD-10-CM | POA: Diagnosis not present

## 2018-11-29 DIAGNOSIS — R109 Unspecified abdominal pain: Secondary | ICD-10-CM | POA: Diagnosis not present

## 2018-12-14 ENCOUNTER — Encounter: Payer: Self-pay | Admitting: Internal Medicine

## 2018-12-14 ENCOUNTER — Ambulatory Visit (INDEPENDENT_AMBULATORY_CARE_PROVIDER_SITE_OTHER): Payer: Medicare HMO | Admitting: Internal Medicine

## 2018-12-14 DIAGNOSIS — R7989 Other specified abnormal findings of blood chemistry: Secondary | ICD-10-CM | POA: Diagnosis not present

## 2018-12-14 DIAGNOSIS — J209 Acute bronchitis, unspecified: Secondary | ICD-10-CM

## 2018-12-14 DIAGNOSIS — E785 Hyperlipidemia, unspecified: Secondary | ICD-10-CM

## 2018-12-14 DIAGNOSIS — F419 Anxiety disorder, unspecified: Secondary | ICD-10-CM | POA: Diagnosis not present

## 2018-12-14 NOTE — Progress Notes (Signed)
Subjective:  Patient ID: Stacy Robinson, female    DOB: [birth year]  Age: 72 y.o. MRN: [id number]  CC: No chief complaint on file.   HPI Stacy Robinson presents for bronchitis f/u - better after steroids and abx F/u CP - no relapse F/u abn TSH (FT3 and FT4 were nl)   Outpatient Medications Prior to Visit  Medication Sig Dispense Refill  . ALPRAZolam (XANAX) 0.25 MG tablet Take 1-2 tablets (0.25-0.5 mg total) by mouth 2 (two) times daily as needed for anxiety or sleep. 30 tablet 1  . b complex vitamins tablet Take 1 tablet by mouth daily.    . diphenhydrAMINE (BENADRYL) 25 MG tablet Take 1-2 tablets (25-50 mg total) by mouth every 4 (four) hours as needed for itching or allergies. 100 tablet 3  . hydrochlorothiazide (MICROZIDE) 12.5 MG capsule Take 1 capsule (12.5 mg total) by mouth daily. 30 capsule 11  . Magnesium 100 MG TABS Take 100 mg by mouth daily.     . potassium chloride (KLOR-CON) 8 MEQ tablet Take 1 tablet (8 mEq total) by mouth daily. 30 tablet 11  . POTASSIUM PO Take 1 tablet by mouth daily. "OTC"     No facility-administered medications prior to visit.     ROS: Review of Systems  Constitutional: Negative.  Negative for activity change, appetite change, chills, diaphoresis, fatigue, fever and unexpected weight change.  HENT: Negative for congestion, ear pain, facial swelling, hearing loss, mouth sores, nosebleeds, postnasal drip, rhinorrhea, sinus pressure, sneezing, sore throat, tinnitus and trouble swallowing.   Eyes: Negative for pain, discharge, redness, itching and visual disturbance.  Respiratory: Negative for cough, chest tightness, shortness of breath, wheezing and stridor.   Cardiovascular: Negative for chest pain, palpitations and leg swelling.  Gastrointestinal: Negative for abdominal distention, anal bleeding, blood in stool, constipation, diarrhea, nausea and rectal pain.  Genitourinary: Negative for difficulty urinating, dysuria, flank pain, frequency,  genital sores, hematuria, pelvic pain, urgency, vaginal bleeding and vaginal discharge.  Musculoskeletal: Negative for arthralgias, back pain, gait problem, joint swelling, neck pain and neck stiffness.  Skin: Negative.  Negative for rash.  Neurological: Negative for dizziness, tremors, seizures, syncope, speech difficulty, weakness, numbness and headaches.  Hematological: Negative for adenopathy. Does not bruise/bleed easily.  Psychiatric/Behavioral: Negative for behavioral problems, decreased concentration, dysphoric mood, sleep disturbance and suicidal ideas. The patient is not nervous/anxious.     Objective:  BP 126/74 (BP Location: Left Arm, Patient Position: Sitting, Cuff Size: Normal)   Pulse 84   Temp 98.3 F (36.8 C) (Oral)   Ht 5\' 8"  (1.727 m)   Wt 177 lb (80.3 kg)   SpO2 97%   BMI 26.91 kg/m   BP Readings from Last 3 Encounters:  12/14/18 126/74  09/13/18 108/74  06/13/18 122/74    Wt Readings from Last 3 Encounters:  12/14/18 177 lb (80.3 kg)  09/13/18 177 lb (80.3 kg)  06/13/18 172 lb (78 kg)    Physical Exam Constitutional:      General: She is not in acute distress.    Appearance: She is well-developed.  HENT:     Head: Normocephalic.     Right Ear: External ear normal.     Left Ear: External ear normal.     Nose: Nose normal.  Eyes:     General:        Right eye: No discharge.        Left eye: No discharge.     Conjunctiva/sclera: Conjunctivae normal.  Pupils: Pupils are equal, round, and reactive to light.  Neck:     Musculoskeletal: Normal range of motion and neck supple.     Thyroid: No thyromegaly.     Vascular: No JVD.     Trachea: No tracheal deviation.  Cardiovascular:     Rate and Rhythm: Normal rate and regular rhythm.     Heart sounds: Normal heart sounds.  Pulmonary:     Effort: No respiratory distress.     Breath sounds: No stridor. No wheezing.  Abdominal:     General: Bowel sounds are normal. There is no distension.      Palpations: Abdomen is soft. There is no mass.     Tenderness: There is no abdominal tenderness. There is no guarding or rebound.  Musculoskeletal:        General: No tenderness.  Lymphadenopathy:     Cervical: No cervical adenopathy.  Skin:    Findings: No erythema or rash.  Neurological:     Cranial Nerves: No cranial nerve deficit.     Motor: No abnormal muscle tone.     Coordination: Coordination normal.     Deep Tendon Reflexes: Reflexes normal.  Psychiatric:        Behavior: Behavior normal.        Thought Content: Thought content normal.        Judgment: Judgment normal.     Lab Results  Component Value Date   WBC 5.3 05/02/2018   HGB 14.9 05/02/2018   HCT 42.1 05/02/2018   PLT 217.0 05/02/2018   GLUCOSE 84 05/02/2018   CHOL 215 (H) 09/07/2017   TRIG 115.0 09/07/2017   HDL 71.40 09/07/2017   LDLDIRECT 113.0 03/13/2013   LDLCALC 121 (H) 09/07/2017   ALT 23 05/02/2018   AST 28 05/02/2018   NA 138 05/02/2018   K 4.3 05/02/2018   CL 104 05/02/2018   CREATININE 0.81 05/02/2018   BUN 15 05/02/2018   CO2 25 05/02/2018   TSH 0.92 05/02/2018   INR 1.03 05/30/2016    US Renal  Result Date: 05/12/2018 CLINICAL DATA:  Microhematuria. EXAM: RENAL / URINARY TRACT ULTRASOUND COMPLETE COMPARISON:  Body CT 05/28/2015 FINDINGS: Right Kidney: Length: 9.7 cm. Echogenicity within normal limits. No solid mass or hydronephrosis visualized. 8 mm benign-appearing exophytic cyst off of the mid polar cortex of the right kidney. Left Kidney: Length: 10.3 cm. Echogenicity within normal limits. No solid mass or hydronephrosis visualized. 1.8 cm benign-appearing cyst off of the upper pole of the right kidney. Bladder: Appears normal for degree of bladder distention. IMPRESSION: Bilateral renal cysts, otherwise unremarkable renal ultrasound. Electronically Signed   By: Fidela Salisbury M.D.   On: 05/12/2018 22:08    Assessment & Plan:   There are no diagnoses linked to this  encounter.   No orders of the defined types were placed in this encounter.    Follow-up: No follow-ups on file.  Walker Kehr, MD

## 2018-12-14 NOTE — Assessment & Plan Note (Signed)
better after steroids and abx

## 2018-12-14 NOTE — Assessment & Plan Note (Signed)
Mildly abn TSH (FT3 and FT4 were nl)

## 2018-12-14 NOTE — Assessment & Plan Note (Signed)
CT angio IMPRESSION: 1. No evidence for acute pulmonary embolus. 2. Prior granulomatous disease 3. Small left pleural effusion and nibasilar atelectasis left greater than right. 4. Aortic atherosclerosis 5. Prior granulomatous disease.   Electronically Signed   By: Kerby Moors M.D.   On: 05/30/2016 10:55

## 2018-12-14 NOTE — Assessment & Plan Note (Signed)
Xanax prn  Potential benefits of a long term benzodiazepines  use as well as potential risks  and complications were explained to the patient and were aknowledged. 

## 2019-03-22 ENCOUNTER — Encounter

## 2019-03-22 ENCOUNTER — Ambulatory Visit: Payer: Medicare HMO | Admitting: Internal Medicine

## 2019-06-12 ENCOUNTER — Encounter: Payer: Self-pay | Admitting: Internal Medicine

## 2019-06-12 ENCOUNTER — Ambulatory Visit (INDEPENDENT_AMBULATORY_CARE_PROVIDER_SITE_OTHER): Payer: Medicare HMO | Admitting: Internal Medicine

## 2019-06-12 ENCOUNTER — Other Ambulatory Visit: Payer: Self-pay

## 2019-06-12 ENCOUNTER — Other Ambulatory Visit (INDEPENDENT_AMBULATORY_CARE_PROVIDER_SITE_OTHER): Payer: Medicare HMO

## 2019-06-12 DIAGNOSIS — R7989 Other specified abnormal findings of blood chemistry: Secondary | ICD-10-CM

## 2019-06-12 DIAGNOSIS — F419 Anxiety disorder, unspecified: Secondary | ICD-10-CM

## 2019-06-12 DIAGNOSIS — R609 Edema, unspecified: Secondary | ICD-10-CM | POA: Diagnosis not present

## 2019-06-12 DIAGNOSIS — E785 Hyperlipidemia, unspecified: Secondary | ICD-10-CM | POA: Diagnosis not present

## 2019-06-12 DIAGNOSIS — J209 Acute bronchitis, unspecified: Secondary | ICD-10-CM

## 2019-06-12 LAB — LIPID PANEL
Cholesterol: 254 mg/dL — ABNORMAL HIGH (ref 0–200)
HDL: 74.6 mg/dL
LDL Cholesterol: 149 mg/dL — ABNORMAL HIGH (ref 0–99)
NonHDL: 179.23
Total CHOL/HDL Ratio: 3
Triglycerides: 152 mg/dL — ABNORMAL HIGH (ref 0.0–149.0)
VLDL: 30.4 mg/dL (ref 0.0–40.0)

## 2019-06-12 LAB — HEPATIC FUNCTION PANEL
ALT: 13 U/L (ref 0–35)
AST: 15 U/L (ref 0–37)
Albumin: 4.2 g/dL (ref 3.5–5.2)
Alkaline Phosphatase: 65 U/L (ref 39–117)
Bilirubin, Direct: 0.1 mg/dL (ref 0.0–0.3)
Total Bilirubin: 0.5 mg/dL (ref 0.2–1.2)
Total Protein: 7.1 g/dL (ref 6.0–8.3)

## 2019-06-12 LAB — BASIC METABOLIC PANEL
BUN: 17 mg/dL (ref 6–23)
CO2: 24 mEq/L (ref 19–32)
Calcium: 9.3 mg/dL (ref 8.4–10.5)
Chloride: 106 mEq/L (ref 96–112)
Creatinine, Ser: 0.76 mg/dL (ref 0.40–1.20)
GFR: 74.16 mL/min (ref >60.00)
Glucose, Bld: 94 mg/dL (ref 70–99)
Potassium: 4.1 mEq/L (ref 3.5–5.1)
Sodium: 139 mEq/L (ref 135–145)

## 2019-06-12 LAB — URINALYSIS, ROUTINE W REFLEX MICROSCOPIC
Bilirubin Urine: NEGATIVE
Ketones, ur: NEGATIVE
Leukocytes,Ua: NEGATIVE
Nitrite: NEGATIVE
Specific Gravity, Urine: 1.025 (ref 1.000–1.030)
Total Protein, Urine: NEGATIVE
Urine Glucose: NEGATIVE
Urobilinogen, UA: 0.2 (ref 0.0–1.0)
pH: 5.5 (ref 5.0–8.0)

## 2019-06-12 LAB — CBC WITH DIFFERENTIAL/PLATELET
Basophils Absolute: 0 10*3/uL (ref 0.0–0.1)
Basophils Relative: 0.7 % (ref 0.0–3.0)
Eosinophils Absolute: 0 10*3/uL (ref 0.0–0.7)
Eosinophils Relative: 0.4 % (ref 0.0–5.0)
HCT: 44 % (ref 36.0–46.0)
Hemoglobin: 15.1 g/dL — ABNORMAL HIGH (ref 12.0–15.0)
Lymphocytes Relative: 37.4 % (ref 12.0–46.0)
Lymphs Abs: 2.3 10*3/uL (ref 0.7–4.0)
MCHC: 34.3 g/dL (ref 30.0–36.0)
MCV: 94.2 fl (ref 78.0–100.0)
Monocytes Absolute: 0.5 10*3/uL (ref 0.1–1.0)
Monocytes Relative: 8.8 % (ref 3.0–12.0)
Neutro Abs: 3.3 10*3/uL (ref 1.4–7.7)
Neutrophils Relative %: 52.7 % (ref 43.0–77.0)
Platelets: 222 10*3/uL (ref 150.0–400.0)
RBC: 4.67 Mil/uL (ref 3.87–5.11)
RDW: 13.7 % (ref 11.5–15.5)
WBC: 6.2 10*3/uL (ref 4.0–10.5)

## 2019-06-12 LAB — T4, FREE: Free T4: 0.94 ng/dL (ref 0.60–1.60)

## 2019-06-12 LAB — T3, FREE: T3, Free: 2.9 pg/mL (ref 2.3–4.2)

## 2019-06-12 LAB — TSH: TSH: 0.74 u[IU]/mL (ref 0.35–4.50)

## 2019-06-12 MED ORDER — POTASSIUM CHLORIDE ER 8 MEQ PO TBCR: 8.0000 meq | EXTENDED_RELEASE_TABLET | Freq: Every day | ORAL | 11 refills | Status: DC

## 2019-06-12 MED ORDER — ALPRAZOLAM 0.25 MG PO TABS: 0.2500 mg | ORAL_TABLET | Freq: Two times a day (BID) | ORAL | 1 refills | Status: DC | PRN

## 2019-06-12 MED ORDER — VITAMIN D3 50 MCG (2000 UT) PO CAPS: 2000.0000 [IU] | ORAL_CAPSULE | Freq: Every day | ORAL | 3 refills | Status: AC

## 2019-06-12 MED ORDER — HYDROCHLOROTHIAZIDE 12.5 MG PO CAPS: 12.5000 mg | ORAL_CAPSULE | Freq: Every day | ORAL | 11 refills | Status: DC

## 2019-06-12 MED ORDER — B COMPLEX PO TABS: 1.0000 | ORAL_TABLET | Freq: Every day | ORAL | 3 refills | Status: AC

## 2019-06-12 NOTE — Assessment & Plan Note (Signed)
Xanax prn  Potential benefits of a long term benzodiazepines  use as well as potential risks  and complications were explained to the patient and were aknowledged. 

## 2019-06-12 NOTE — Assessment & Plan Note (Signed)
TSH, FT4 

## 2019-06-12 NOTE — Assessment & Plan Note (Signed)
HCTZ, KCl

## 2019-06-12 NOTE — Progress Notes (Signed)
Subjective:  Patient ID: Stacy Robinson, female    DOB: 26-Jun-1946  Age: 73 y.o. MRN: [id number]  CC: No chief complaint on file.   HPI Stacy Robinson presents for Vit D def, edema, anxiety f/u   Outpatient Medications Prior to Visit  Medication Sig Dispense Refill  . ALPRAZolam (XANAX) 0.25 MG tablet Take 1-2 tablets (0.25-0.5 mg total) by mouth 2 (two) times daily as needed for anxiety or sleep. 30 tablet 1  . b complex vitamins tablet Take 1 tablet by mouth daily.    . diphenhydrAMINE (BENADRYL) 25 MG tablet Take 1-2 tablets (25-50 mg total) by mouth every 4 (four) hours as needed for itching or allergies. 100 tablet 3  . hydrochlorothiazide (MICROZIDE) 12.5 MG capsule Take 1 capsule (12.5 mg total) by mouth daily. 30 capsule 11  . Magnesium 100 MG TABS Take 100 mg by mouth daily.     [name] POTASSIUM PO Take 1 tablet by mouth daily. "OTC"    . potassium chloride (KLOR-CON) 8 MEQ tablet Take 1 tablet (8 mEq total) by mouth daily. 30 tablet 11   No facility-administered medications prior to visit.     ROS: Review of Systems  Constitutional: Negative for activity change, appetite change, chills, fatigue and unexpected weight change.  HENT: Negative for congestion, mouth sores and sinus pressure.   Eyes: Negative for visual disturbance.  Respiratory: Negative for cough and chest tightness.   Gastrointestinal: Negative for abdominal pain and nausea.  Genitourinary: Negative for difficulty urinating, frequency and vaginal pain.  Musculoskeletal: Negative for back pain and gait problem.  Skin: Negative for pallor and rash.  Neurological: Negative for dizziness, tremors, weakness, numbness and headaches.  Psychiatric/Behavioral: Negative for confusion, sleep disturbance and suicidal ideas.    Objective:  BP 134/82 (BP Location: Right Arm, Patient Position: Sitting, Cuff Size: Normal)   Pulse 97   Temp (!) 97.3 F (36.3 C) (Oral)   Ht 5\' 8"  (1.727 m)   Wt 179 lb (81.2 kg)   SpO2  96%   BMI 27.22 kg/m   BP Readings from Last 3 Encounters:  06/12/19 134/82  12/14/18 126/74  09/13/18 108/74    Wt Readings from Last 3 Encounters:  06/12/19 179 lb (81.2 kg)  12/14/18 177 lb (80.3 kg)  09/13/18 177 lb (80.3 kg)    Physical Exam Constitutional:      General: She is not in acute distress.    Appearance: She is well-developed.  HENT:     Head: Normocephalic.     Right Ear: External ear normal.     Left Ear: External ear normal.     Nose: Nose normal.  Eyes:     General:        Right eye: No discharge.        Left eye: No discharge.     Conjunctiva/sclera: Conjunctivae normal.     Pupils: Pupils are equal, round, and reactive to light.  Neck:     Musculoskeletal: Normal range of motion and neck supple.     Thyroid: No thyromegaly.     Vascular: No JVD.     Trachea: No tracheal deviation.  Cardiovascular:     Rate and Rhythm: Normal rate and regular rhythm.     Heart sounds: Normal heart sounds.  Pulmonary:     Effort: No respiratory distress.     Breath sounds: No stridor. No wheezing.  Abdominal:     General: Bowel sounds are normal. There is no distension.  Palpations: Abdomen is soft. There is no mass.     Tenderness: There is no abdominal tenderness. There is no guarding or rebound.  Musculoskeletal:        General: No tenderness.  Lymphadenopathy:     Cervical: No cervical adenopathy.  Skin:    Findings: No erythema or rash.  Neurological:     Cranial Nerves: No cranial nerve deficit.     Motor: No abnormal muscle tone.     Coordination: Coordination normal.     Deep Tendon Reflexes: Reflexes normal.  Psychiatric:        Behavior: Behavior normal.        Thought Content: Thought content normal.        Judgment: Judgment normal.     Lab Results  Component Value Date   WBC 5.3 05/02/2018   HGB 14.9 05/02/2018   HCT 42.1 05/02/2018   PLT 217.0 05/02/2018   GLUCOSE 84 05/02/2018   CHOL 215 (H) 09/07/2017   TRIG 115.0  09/07/2017   HDL 71.40 09/07/2017   LDLDIRECT 113.0 03/13/2013   LDLCALC 121 (H) 09/07/2017   ALT 23 05/02/2018   AST 28 05/02/2018   NA 138 05/02/2018   K 4.3 05/02/2018   CL 104 05/02/2018   CREATININE 0.81 05/02/2018   BUN 15 05/02/2018   CO2 25 05/02/2018   TSH 0.92 05/02/2018   INR 1.03 05/30/2016    US Renal  Result Date: 05/12/2018 CLINICAL DATA:  Microhematuria. EXAM: RENAL / URINARY TRACT ULTRASOUND COMPLETE COMPARISON:  Body CT 05/28/2015 FINDINGS: Right Kidney: Length: 9.7 cm. Echogenicity within normal limits. No solid mass or hydronephrosis visualized. 8 mm benign-appearing exophytic cyst off of the mid polar cortex of the right kidney. Left Kidney: Length: 10.3 cm. Echogenicity within normal limits. No solid mass or hydronephrosis visualized. 1.8 cm benign-appearing cyst off of the upper pole of the right kidney. Bladder: Appears normal for degree of bladder distention. IMPRESSION: Bilateral renal cysts, otherwise unremarkable renal ultrasound. Electronically Signed   By: Fidela Salisbury M.D.   On: 05/12/2018 22:08    Assessment & Plan:   There are no diagnoses linked to this encounter.   No orders of the defined types were placed in this encounter.    Follow-up: No follow-ups on file.  Walker Kehr, MD

## 2019-06-12 NOTE — Assessment & Plan Note (Signed)
Valerian root 

## 2019-06-12 NOTE — Assessment & Plan Note (Signed)
  On diet  

## 2019-06-19 ENCOUNTER — Ambulatory Visit: Payer: Medicare HMO | Admitting: Internal Medicine

## 2019-08-28 ENCOUNTER — Telehealth: Payer: Self-pay | Admitting: Internal Medicine

## 2019-08-29 ENCOUNTER — Telehealth: Payer: Self-pay | Admitting: *Deleted

## 2019-08-29 NOTE — Telephone Encounter (Signed)
Copied from Day [id number]. Topic: General - Other >> Aug 28, 2019 11:43 AM Keene Breath wrote: Reason for CRM: Patient called to ask the nurse to call her regarding her medication for hydrochlorothiazide (MICROZIDE) 12.5 MG capsule.  She stated that she is having a bad reaction and would like it switched.  Please call patient back at [phone]

## 2019-08-29 NOTE — Telephone Encounter (Signed)
I called pt- she states she is not tolerating HCTZ well. She c/o abdominal gas and black, sticky stools while she was taking it regularly. She has held HCTZ x 1 week and all sxs have resolved. She wants to know if she can go back to taking what she has taken before, looks like it was furosemide. Please advise.

## 2019-08-29 NOTE — Telephone Encounter (Signed)
Error

## 2019-08-30 MED ORDER — LOSARTAN POTASSIUM 50 MG PO TABS: 50.0000 mg | ORAL_TABLET | Freq: Every day | ORAL | 3 refills | Status: DC

## 2019-08-30 NOTE — Telephone Encounter (Signed)
Please discontinue HCTZ.  I emailed a prescription for a low dose of losartan.  Please schedule a follow-up office visit in 6-8 weeks.  Thank you

## 2019-08-31 NOTE — Telephone Encounter (Signed)
Pt informed- she states she doesn't have a BP problem, she was requesting something for fluid retention to use prn. Please advise.

## 2019-09-01 MED ORDER — FUROSEMIDE 20 MG PO TABS: 20.0000 mg | ORAL_TABLET | Freq: Every day | ORAL | 3 refills | Status: AC | PRN

## 2019-09-01 NOTE — Telephone Encounter (Signed)
Ok furosemide Rx emailed Thx

## 2019-09-01 NOTE — Addendum Note (Signed)
Addended by: Cresenciano Lick on: 09/01/2019 12:59 PM   Modules accepted: Orders

## 2019-09-01 NOTE — Telephone Encounter (Signed)
Left detailed message informing pt of below.  

## 2019-09-12 ENCOUNTER — Telehealth: Payer: Self-pay | Admitting: *Deleted

## 2019-09-12 NOTE — Telephone Encounter (Signed)
Copied from [place] [phone]. Topic: General - Other >> Sep 11, 2019  1:47 PM Sheran Luz wrote: Patient requesting call back from [facility] to discuss BP medication.

## 2019-09-12 NOTE — Telephone Encounter (Signed)
I called pt- she states she read in the Furosemide pamphlet and it states it can also be used to treat hypertension. She has decided not to take it.  I advised her to only take Furosemide as needed if she has edema Patient verbalized understanding.

## 2019-12-12 ENCOUNTER — Ambulatory Visit: Payer: Medicare HMO | Admitting: Internal Medicine

## 2020-02-19 DIAGNOSIS — H43822 Vitreomacular adhesion, left eye: Secondary | ICD-10-CM | POA: Diagnosis not present

## 2020-02-19 DIAGNOSIS — H26492 Other secondary cataract, left eye: Secondary | ICD-10-CM | POA: Diagnosis not present

## 2020-02-19 DIAGNOSIS — Z961 Presence of intraocular lens: Secondary | ICD-10-CM | POA: Diagnosis not present

## 2020-03-21 ENCOUNTER — Ambulatory Visit (INDEPENDENT_AMBULATORY_CARE_PROVIDER_SITE_OTHER): Payer: Medicare HMO | Admitting: Internal Medicine

## 2020-03-21 ENCOUNTER — Encounter: Payer: Self-pay | Admitting: Internal Medicine

## 2020-03-21 ENCOUNTER — Other Ambulatory Visit: Payer: Self-pay

## 2020-03-21 VITALS — BP 124/80 | HR 77 | Temp 98.1°F | Ht 68.0 in | Wt 183.0 lb

## 2020-03-21 DIAGNOSIS — R1084 Generalized abdominal pain: Secondary | ICD-10-CM | POA: Diagnosis not present

## 2020-03-21 DIAGNOSIS — R202 Paresthesia of skin: Secondary | ICD-10-CM

## 2020-03-21 DIAGNOSIS — G8929 Other chronic pain: Secondary | ICD-10-CM

## 2020-03-21 DIAGNOSIS — M545 Low back pain: Secondary | ICD-10-CM

## 2020-03-21 DIAGNOSIS — R109 Unspecified abdominal pain: Secondary | ICD-10-CM | POA: Insufficient documentation

## 2020-03-21 DIAGNOSIS — R5383 Other fatigue: Secondary | ICD-10-CM

## 2020-03-21 LAB — CBC WITH DIFFERENTIAL/PLATELET
Basophils Absolute: 0.1 10*3/uL (ref 0.0–0.1)
Basophils Relative: 1 % (ref 0.0–3.0)
Eosinophils Absolute: 0.1 10*3/uL (ref 0.0–0.7)
Eosinophils Relative: 0.8 % (ref 0.0–5.0)
HCT: 43.1 % (ref 36.0–46.0)
Hemoglobin: 14.8 g/dL (ref 12.0–15.0)
Lymphocytes Relative: 37.4 % (ref 12.0–46.0)
Lymphs Abs: 2.5 10*3/uL (ref 0.7–4.0)
MCHC: 34.3 g/dL (ref 30.0–36.0)
MCV: 95.3 fl (ref 78.0–100.0)
Monocytes Absolute: 0.6 10*3/uL (ref 0.1–1.0)
Monocytes Relative: 8.4 % (ref 3.0–12.0)
Neutro Abs: 3.5 10*3/uL (ref 1.4–7.7)
Neutrophils Relative %: 52.4 % (ref 43.0–77.0)
Platelets: 245 10*3/uL (ref 150.0–400.0)
RBC: 4.52 Mil/uL (ref 3.87–5.11)
RDW: 13.4 % (ref 11.5–15.5)
WBC: 6.6 10*3/uL (ref 4.0–10.5)

## 2020-03-21 LAB — URINALYSIS
Bilirubin Urine: NEGATIVE
Ketones, ur: NEGATIVE
Leukocytes,Ua: NEGATIVE
Nitrite: NEGATIVE
Specific Gravity, Urine: 1.02 (ref 1.000–1.030)
Total Protein, Urine: NEGATIVE
Urine Glucose: NEGATIVE
Urobilinogen, UA: 0.2 (ref 0.0–1.0)
pH: 6.5 (ref 5.0–8.0)

## 2020-03-21 LAB — HEPATIC FUNCTION PANEL
ALT: 31 U/L (ref 0–35)
AST: 24 U/L (ref 0–37)
Albumin: 4.3 g/dL (ref 3.5–5.2)
Alkaline Phosphatase: 69 U/L (ref 39–117)
Bilirubin, Direct: 0.1 mg/dL (ref 0.0–0.3)
Total Bilirubin: 0.6 mg/dL (ref 0.2–1.2)
Total Protein: 7.6 g/dL (ref 6.0–8.3)

## 2020-03-21 LAB — BASIC METABOLIC PANEL
BUN: 22 mg/dL (ref 6–23)
CO2: 27 mEq/L (ref 19–32)
Calcium: 9.6 mg/dL (ref 8.4–10.5)
Chloride: 100 mEq/L (ref 96–112)
Creatinine, Ser: 0.88 mg/dL (ref 0.40–1.20)
GFR: 62.49 mL/min (ref >60.00)
Glucose, Bld: 86 mg/dL (ref 70–99)
Potassium: 4.6 mEq/L (ref 3.5–5.1)
Sodium: 136 mEq/L (ref 135–145)

## 2020-03-21 LAB — VITAMIN B12: Vitamin B-12: 324 pg/mL (ref 211–911)

## 2020-03-21 LAB — TSH: TSH: 0.93 u[IU]/mL (ref 0.35–4.50)

## 2020-03-21 LAB — T3, FREE: T3, Free: 2.9 pg/mL (ref 2.3–4.2)

## 2020-03-21 MED ORDER — TRAMADOL HCL 50 MG PO TABS: 50.0000 mg | ORAL_TABLET | Freq: Four times a day (QID) | ORAL | 0 refills | Status: DC | PRN

## 2020-03-21 NOTE — Assessment & Plan Note (Signed)
C/o stomach bloating - ?etiology C/o something hurts w/bending over in the upper abdomen. Last colon 2009 - refusing a repeat one Labs CT abd

## 2020-03-21 NOTE — Progress Notes (Signed)
Subjective:  Patient ID: Stacy Robinson, female    DOB: 02/09/1946  Age: 74 y.o. MRN: [id number]  CC: No chief complaint on file.   HPI Dareth V Geffre presents for a stomach bloating  C/o something hurts w/bending over in the upper abdomen. Last colon 2009 C/o fatigue  Outpatient Medications Prior to Visit  Medication Sig Dispense Refill  . ALPRAZolam (XANAX) 0.25 MG tablet Take 1-2 tablets (0.25-0.5 mg total) by mouth 2 (two) times daily as needed for anxiety or sleep. 30 tablet 1  . b complex vitamins tablet Take 1 tablet by mouth daily. 100 tablet 3  . Cholecalciferol (VITAMIN D3) 50 MCG (2000 UT) capsule Take 1 capsule (2,000 Units total) by mouth daily. 100 capsule 3  . diphenhydrAMINE (BENADRYL) 25 MG tablet Take 1-2 tablets (25-50 mg total) by mouth every 4 (four) hours as needed for itching or allergies. 100 tablet 3  . furosemide (LASIX) 20 MG tablet Take 1-2 tablets (20-40 mg total) by mouth daily as needed for edema. 60 tablet 3  . Magnesium 100 MG TABS Take 100 mg by mouth daily.     . potassium chloride (KLOR-CON) 8 MEQ tablet Take 1 tablet (8 mEq total) by mouth daily. 30 tablet 11   No facility-administered medications prior to visit.    ROS: Review of Systems  Constitutional: Negative for activity change, appetite change, chills, fatigue and unexpected weight change.  HENT: Negative for congestion, mouth sores and sinus pressure.   Eyes: Negative for visual disturbance.  Respiratory: Negative for cough and chest tightness.   Gastrointestinal: Positive for abdominal distention and abdominal pain. Negative for nausea.  Genitourinary: Negative for difficulty urinating, frequency and vaginal pain.  Musculoskeletal: Negative for back pain and gait problem.  Skin: Negative for pallor and rash.  Neurological: Negative for dizziness, tremors, weakness, numbness and headaches.  Psychiatric/Behavioral: Negative for confusion and sleep disturbance.    Objective:  BP  124/80 (BP Location: Left Arm, Patient Position: Sitting, Cuff Size: Normal)   Pulse 77   Temp 98.1 F (36.7 C) (Oral)   Ht 5\' 8"  (1.727 m)   Wt 183 lb (83 kg)   SpO2 98%   BMI 27.83 kg/m   BP Readings from Last 3 Encounters:  03/21/20 124/80  06/12/19 134/82  12/14/18 126/74    Wt Readings from Last 3 Encounters:  03/21/20 183 lb (83 kg)  06/12/19 179 lb (81.2 kg)  12/14/18 177 lb (80.3 kg)    Physical Exam Constitutional:      General: She is not in acute distress.    Appearance: She is well-developed.  HENT:     Head: Normocephalic.     Right Ear: External ear normal.     Left Ear: External ear normal.     Nose: Nose normal.  Eyes:     General:        Right eye: No discharge.        Left eye: No discharge.     Conjunctiva/sclera: Conjunctivae normal.     Pupils: Pupils are equal, round, and reactive to light.  Neck:     Thyroid: No thyromegaly.     Vascular: No JVD.     Trachea: No tracheal deviation.  Cardiovascular:     Rate and Rhythm: Normal rate and regular rhythm.     Heart sounds: Normal heart sounds.  Pulmonary:     Effort: No respiratory distress.     Breath sounds: No stridor. No wheezing.  Abdominal:  General: Bowel sounds are normal. There is distension.     Palpations: Abdomen is soft. There is no mass.     Tenderness: There is abdominal tenderness. There is no guarding or rebound.  Musculoskeletal:        General: No tenderness.     Cervical back: Normal range of motion and neck supple.  Lymphadenopathy:     Cervical: No cervical adenopathy.  Skin:    Findings: No erythema or rash.  Neurological:     Cranial Nerves: No cranial nerve deficit.     Motor: No abnormal muscle tone.     Coordination: Coordination normal.     Deep Tendon Reflexes: Reflexes normal.  Psychiatric:        Behavior: Behavior normal.        Thought Content: Thought content normal.        Judgment: Judgment normal.    Sensitive - upper 1/2  Lab Results    Component Value Date   WBC 6.2 06/12/2019   HGB 15.1 (H) 06/12/2019   HCT 44.0 06/12/2019   PLT 222.0 06/12/2019   GLUCOSE 94 06/12/2019   CHOL 254 (H) 06/12/2019   TRIG 152.0 (H) 06/12/2019   HDL 74.60 06/12/2019   LDLDIRECT 113.0 03/13/2013   LDLCALC 149 (H) 06/12/2019   ALT 13 06/12/2019   AST 15 06/12/2019   NA 139 06/12/2019   K 4.1 06/12/2019   CL 106 06/12/2019   CREATININE 0.76 06/12/2019   BUN 17 06/12/2019   CO2 24 06/12/2019   TSH 0.74 06/12/2019   INR 1.03 05/30/2016    US Renal  Result Date: 05/12/2018 CLINICAL DATA:  Microhematuria. EXAM: RENAL / URINARY TRACT ULTRASOUND COMPLETE COMPARISON:  Body CT 05/28/2015 FINDINGS: Right Kidney: Length: 9.7 cm. Echogenicity within normal limits. No solid mass or hydronephrosis visualized. 8 mm benign-appearing exophytic cyst off of the mid polar cortex of the right kidney. Left Kidney: Length: 10.3 cm. Echogenicity within normal limits. No solid mass or hydronephrosis visualized. 1.8 cm benign-appearing cyst off of the upper pole of the right kidney. Bladder: Appears normal for degree of bladder distention. IMPRESSION: Bilateral renal cysts, otherwise unremarkable renal ultrasound. Electronically Signed   By: Fidela Salisbury M.D.   On: 05/12/2018 22:08    Assessment & Plan:   There are no diagnoses linked to this encounter.   No orders of the defined types were placed in this encounter.    Follow-up: No follow-ups on file.  Walker Kehr, MD

## 2020-03-21 NOTE — Addendum Note (Signed)
Addended by: Cresenciano Lick on: 03/21/2020 09:59 AM   Modules accepted: Orders

## 2020-03-21 NOTE — Patient Instructions (Signed)
Lactose Intolerance, Adult Lactose is a natural sugar that is found in dairy milk and dairy products such as cheese and yogurt. Lactose is digested by lactase, a protein (enzyme) in your small intestine. Some people do not produce enough lactase to digest lactose. This is called lactose intolerance. Lactose intolerance is different from milk allergy, which is a more serious reaction to the protein in milk. What are the causes? Causes of lactose intolerance may include:  Normal aging. The ability to produce lactase may lessen with age, causing lactose intolerance over time.  Being born without the ability to make lactase.  Digestive diseases such as gastroenteritis or inflammatory bowel disease (IBD).  Surgery or injury to your small intestine.  Infection in your intestines.  Certain antibiotic medicines and cancer treatments. What are the signs or symptoms? Lactose intolerance can cause discomfort within 30 minutes to 2 hours after you eat or drink something that contains lactose. Symptoms may include:  Nausea.  Diarrhea.  Cramps or pain in the abdomen.  A full, tight, or painful feeling in the abdomen (bloating).  Gas. How is this diagnosed? This condition may be diagnosed based on:  Your symptoms and medical history.  Lactose tolerance test. This test involves drinking a lactose solution and then having blood tests to measure the amount of glucose in your blood. If your blood glucose level does not go up, it means your body is not able to digest the lactose.  Lactose breath test (hydrogen breath test). This test involves drinking a lactose solution and then exhaling into a type of bag while you digest the solution. Having a lot of hydrogen in your breath can be a sign of lactose intolerance.  Stool acidity test. This involves drinking a lactose solution and then having your stool samples tested for bacteria. Having a lot of bacteria causes stool to be considered acidic, which  is a sign of lactose intolerance. How is this treated? There is no treatment to improve your body's ability to produce lactase. However, you can manage your symptoms at home by:  Limiting or avoiding dairy milk, dairy products, and other sources of lactose.  Taking lactase tablets when you eat milk products. Lactase tablets are over-the-counter medicines that help to improve lactose digestion. You may also add lactase drops to regular milk.  Adjusting your diet, such as drinking lactose-free milk. Lactose tolerance varies from person to person. Some people may be able to eat or drink small amounts of products that contain lactose, and other people may need to avoid all foods and drinks that contain lactose. Talk with your health care provider about what treatment is best for you. Follow these instructions at home:  Limit or avoid foods, beverages, and medicines that contain lactose, as told by your health care provider. Keep track of which foods, beverages, or medicines cause symptoms so you can decide what to avoid in the future.  Read food and medicine labels carefully. Avoid products that contain: ? Lactose. ? Milk solids. ? Casein. ? Whey.  Take over-the-counter and prescription medicines (including lactase tablets) only as told by your health care provider.  If you stop eating and drinking dairy products (eliminate dairy from your diet), make sure to get enough protein, calcium, and vitamin D from other foods. Work with your health care provider or a diet and nutrition specialist (dietitian) to make sure you get enough of those nutrients.  Choose a milk substitute that is fortified with calcium and vitamin D. ? Soy milk   contains high-quality protein. ? Milks that are made from nuts or grains (such as almond milk and rice milk) contain very small amounts of protein.  Keep all follow-up visits as told by your health care provider. This is important. Contact a health care provider  if:  You have no relief from your symptoms after you have eliminated milk products and other sources of lactose. Get help right away if:  You have blood in your stool.  You have severe abdomen (abdominal) pain. Summary  Lactose is a natural sugar that is found in dairy milk and dairy products such as cheese and yogurt. Lactose is digested by lactase, which is a protein (enzyme) in the small intestine.  Some people do not produce enough lactase to digest lactose. This is called lactose intolerance.  Lactose intolerance can cause discomfort within 30 minutes to 2 hours after you eat or drink something that contains lactose.  Limit or avoid foods, beverages, and medicines that contain lactose, as told by your health care provider. This information is not intended to replace advice given to you by your health care provider. Make sure you discuss any questions you have with your health care provider. Document Revised: 11/26/2017 Document Reviewed: 07/30/2017 Elsevier Patient Education  2020 Elsevier Inc.  

## 2020-03-21 NOTE — Assessment & Plan Note (Signed)
Tramadol prn - rare  Potential benefits of a long term opioids use as well as potential risks (i.e. addiction risk, apnea etc) and complications (i.e. Somnolence, constipation and others) were explained to the patient and were aknowledged.

## 2020-03-28 ENCOUNTER — Inpatient Hospital Stay: Admission: RE | Admit: 2020-03-28 | Payer: Medicare HMO | Source: Ambulatory Visit

## 2020-03-28 ENCOUNTER — Telehealth: Payer: Self-pay | Admitting: Internal Medicine

## 2020-03-28 NOTE — Telephone Encounter (Signed)
FYI: Received a msg from [facility]:  "Good Morning- Stacy Robinson MRN [id number] called left a message and canceled her 1 pm appt today for A/P for Dr. Alain Marion. She said she is not going to "willfully drink poison" . She said you can call back if you want"

## 2020-03-28 NOTE — Telephone Encounter (Signed)
FYI

## 2020-03-29 NOTE — Telephone Encounter (Signed)
Noted. Thx.

## 2020-04-09 ENCOUNTER — Ambulatory Visit (INDEPENDENT_AMBULATORY_CARE_PROVIDER_SITE_OTHER)
Admission: RE | Admit: 2020-04-09 | Discharge: 2020-04-09 | Disposition: A | Discharge status: Home / Self Care | Payer: Medicare HMO | Source: Ambulatory Visit | Attending: Internal Medicine | Admitting: Internal Medicine

## 2020-04-09 ENCOUNTER — Other Ambulatory Visit: Payer: Self-pay

## 2020-04-09 DIAGNOSIS — R1084 Generalized abdominal pain: Secondary | ICD-10-CM

## 2020-04-09 DIAGNOSIS — K5790 Diverticulosis of intestine, part unspecified, without perforation or abscess without bleeding: Secondary | ICD-10-CM | POA: Diagnosis not present

## 2020-04-09 MED ORDER — IOHEXOL 300 MG/ML  SOLN
100.0000 mL | Freq: Once | INTRAMUSCULAR | Status: AC | PRN
  Administered 2020-04-09: 100 mL via INTRAVENOUS

## 2020-04-18 ENCOUNTER — Other Ambulatory Visit: Payer: Self-pay | Admitting: Internal Medicine

## 2020-04-18 DIAGNOSIS — K625 Hemorrhage of anus and rectum: Secondary | ICD-10-CM

## 2020-04-18 DIAGNOSIS — R1084 Generalized abdominal pain: Secondary | ICD-10-CM

## 2020-04-19 ENCOUNTER — Encounter: Payer: Self-pay | Admitting: Physician Assistant

## 2020-04-24 DIAGNOSIS — L03211 Cellulitis of face: Secondary | ICD-10-CM | POA: Diagnosis not present

## 2020-04-24 DIAGNOSIS — J019 Acute sinusitis, unspecified: Secondary | ICD-10-CM | POA: Diagnosis not present

## 2020-04-24 DIAGNOSIS — B9689 Other specified bacterial agents as the cause of diseases classified elsewhere: Secondary | ICD-10-CM | POA: Diagnosis not present

## 2020-05-02 ENCOUNTER — Ambulatory Visit: Payer: Medicare HMO | Admitting: Physician Assistant

## 2020-06-03 ENCOUNTER — Telehealth: Payer: Self-pay

## 2020-06-03 DIAGNOSIS — L259 Unspecified contact dermatitis, unspecified cause: Secondary | ICD-10-CM | POA: Diagnosis not present

## 2020-06-03 NOTE — Telephone Encounter (Signed)
F/u  Call patient back  The patient voiced she was heading out of the house to [facility] thanked [place] for a callback

## 2020-06-03 NOTE — Telephone Encounter (Signed)
New message    The patient calling C/o poison ivy rash happen last Thursday / Friday, Asking for a shot. No ED/Urgent Care   Please advise.

## 2020-06-03 NOTE — Telephone Encounter (Signed)
Please schedule OV with any available provider. Thanks!

## 2020-06-04 ENCOUNTER — Ambulatory Visit: Payer: Medicare HMO | Admitting: Physician Assistant

## 2020-06-27 ENCOUNTER — Ambulatory Visit: Payer: Medicare HMO | Admitting: Physician Assistant

## 2020-12-12 ENCOUNTER — Telehealth (INDEPENDENT_AMBULATORY_CARE_PROVIDER_SITE_OTHER): Payer: Medicare HMO | Admitting: Internal Medicine

## 2020-12-12 ENCOUNTER — Encounter: Payer: Self-pay | Admitting: Internal Medicine

## 2020-12-12 DIAGNOSIS — R197 Diarrhea, unspecified: Secondary | ICD-10-CM

## 2020-12-12 MED ORDER — ONDANSETRON HCL 4 MG PO TABS: 4.0000 mg | ORAL_TABLET | Freq: Three times a day (TID) | ORAL | 0 refills | Status: DC | PRN

## 2020-12-12 NOTE — Progress Notes (Signed)
Virtual Visit via Audio Note  I connected with Stacy Robinson on 12/12/20 at  3:00 PM EST by an audio-only enabled telemedicine application and verified that I am speaking with the correct person using two identifiers.  The patient and the provider were at separate locations throughout the entire encounter. Patient location: home, Provider location: work   I discussed the limitations of evaluation and management by telemedicine and the availability of in person appointments. The patient expressed understanding and agreed to proceed. The patient and the provider were the only parties present for the visit unless noted in HPI below.  History of Present Illness: The patient is a 74 y.o. female with visit for diarrhea and stomach pain. Vomiting mostly bile and water. Started 2 weeks. Imodium can help some with the diarrhea which is otherwise every time she is eating or drinking. Has had ulcer in the past and this pain feels similar. No antibiotics in the last 3-4 months. Denies fevers but some chills. Cannot eat or drink anything lately. Took 4 imodium today alone. Drinking electrolyte drink today and some liquids. Did eat cheese sandwich and vomited not too long afterwards. Eating does not change the stomach pain. Overall it is not improving. Feeling lightheaded this morning.   Observations/Objective: Voice strong, no dyspnea. A and O times 3.  Assessment and Plan: See problem oriented charting  Follow Up Instructions:  Labs and stool studies needed  Visit time 13 minutes in non-face to face communication with patient and coordination of care  I discussed the assessment and treatment plan with the patient. The patient was provided an opportunity to ask questions and all were answered. The patient agreed with the plan and demonstrated an understanding of the instructions.   The patient was advised to call back or seek an in-person evaluation if the symptoms worsen or if the condition fails to  improve as anticipated.  Hoyt Koch, MD

## 2020-12-13 ENCOUNTER — Other Ambulatory Visit (INDEPENDENT_AMBULATORY_CARE_PROVIDER_SITE_OTHER): Payer: Medicare HMO

## 2020-12-13 DIAGNOSIS — R197 Diarrhea, unspecified: Secondary | ICD-10-CM | POA: Diagnosis not present

## 2020-12-13 LAB — COMPREHENSIVE METABOLIC PANEL
ALT: 20 U/L (ref 0–35)
AST: 17 U/L (ref 0–37)
Albumin: 3.9 g/dL (ref 3.5–5.2)
Alkaline Phosphatase: 57 U/L (ref 39–117)
BUN: 12 mg/dL (ref 6–23)
CO2: 27 mEq/L (ref 19–32)
Calcium: 9.1 mg/dL (ref 8.4–10.5)
Chloride: 104 mEq/L (ref 96–112)
Creatinine, Ser: 0.82 mg/dL (ref 0.40–1.20)
GFR: 69.38 mL/min (ref >60.00)
Glucose, Bld: 87 mg/dL (ref 70–99)
Potassium: 3.9 mEq/L (ref 3.5–5.1)
Sodium: 137 mEq/L (ref 135–145)
Total Bilirubin: 0.6 mg/dL (ref 0.2–1.2)
Total Protein: 7 g/dL (ref 6.0–8.3)

## 2020-12-13 LAB — CBC
HCT: 39.7 % (ref 36.0–46.0)
Hemoglobin: 14.2 g/dL (ref 12.0–15.0)
MCHC: 35.7 g/dL (ref 30.0–36.0)
MCV: 98.3 fl (ref 78.0–100.0)
Platelets: 207 10*3/uL (ref 150.0–400.0)
RBC: 4.03 Mil/uL (ref 3.87–5.11)
RDW: 12.6 % (ref 11.5–15.5)
WBC: 4.4 10*3/uL (ref 4.0–10.5)

## 2020-12-13 LAB — LIPASE: Lipase: 52 U/L (ref 11.0–59.0)

## 2020-12-13 NOTE — Assessment & Plan Note (Signed)
Getting GI panel stool studies as well as CBC and CMP and lipase to assess for causes and signs of dehydration. Rx zofran for nausea in case her ginger supplements do not help. Push fluids and brat diet. Treat as appropriate.

## 2020-12-17 LAB — GI PROFILE, STOOL, PCR

## 2020-12-23 ENCOUNTER — Telehealth: Payer: Self-pay | Admitting: Internal Medicine

## 2020-12-23 NOTE — Telephone Encounter (Signed)
Patient is requesting a call back in regards to her recent lab work. She can be reached at [phone].

## 2020-12-25 NOTE — Telephone Encounter (Signed)
Pt notified of result note.  Denies symptoms at this time; states they subsided Christmas day.  Denies further ques/concerns at this time.

## 2020-12-25 NOTE — Telephone Encounter (Signed)
LDVM for pt of Dr. Frutoso Chase note "no infection present in stool."

## 2021-01-24 NOTE — Telephone Encounter (Signed)
Pt states she received notification from [facility] that she has a GI disease.  Will mail labs results from samples collected on 12/13/20 as there was no detection of bacteria in her stool at that time.  Pt states the correspondence she received from [facility] stated that bacteria was detected in her blood. Pt read correspondence & it sounds like it's a bill or EOB. Pt also states she has been having intermittent diarrhea & stomach pain since meeting with Dr Sharlet Salina. Pt states at the time she was called about the results, she was not experiencing symptoms. Pt declines offer to schedule OV.

## 2021-01-24 NOTE — Telephone Encounter (Signed)
Patient states she got a letter from [place] stating that a gastro intestinal disease causing bacteria was detected and is very upset because "Whoever is not doing their job and letting me die is going to be responsible for this" and would like to talk to someone about this. Patient states she still doesn't feel good from back when she saw Dr. Sharlet Salina.

## 2021-01-27 NOTE — Telephone Encounter (Signed)
Pt texted RN a copy of the correspondence she received (no patient ID on message).  It appears to be an EOB from her insurance company with the test included.  The pt states that it is not on EOB & that [employer] sends her a monthly summary of all the things that she has done for the month.  When asked what she wants me to do pt states "there's no way there's nothing wrong with me because you don't have diarrhea for a month".  Pt offered again to make OV if she is still having symptoms & pt declines.  States she does not want to have any more tests done.  States she will call [employer] after receiving the lab results I sent in the mail.

## 2021-01-27 NOTE — Telephone Encounter (Signed)
   Patient states she is still wanting explanation of results Advised patient copy sent to her in the mail Patient wants explanation of discrepancy between [facility] and Lab  She states she will not do additional testing at this time  Requesting call from Supervisor or provider

## 2021-02-17 ENCOUNTER — Telehealth: Payer: Self-pay

## 2021-02-17 ENCOUNTER — Other Ambulatory Visit: Payer: Self-pay

## 2021-02-17 NOTE — Telephone Encounter (Signed)
RN reaching out to pt to inquire about reason for visit on 02/18/21.  Pt states she is no longer having vomiting or diarrhea, but would like a check up for the symptoms she was having.  She also states that she is noticing increased joint pain that she would like to speak to PCP about.  States her overall desire for the visit "is to be sure I can live comfortably".  Will update notes for appt to reflect reason for OV.

## 2021-02-18 ENCOUNTER — Ambulatory Visit (INDEPENDENT_AMBULATORY_CARE_PROVIDER_SITE_OTHER): Payer: Medicare HMO | Admitting: Internal Medicine

## 2021-02-18 ENCOUNTER — Encounter: Payer: Self-pay | Admitting: Internal Medicine

## 2021-02-18 VITALS — BP 120/82 | HR 107 | Temp 98.0°F | Ht 68.0 in | Wt 167.0 lb

## 2021-02-18 DIAGNOSIS — M545 Low back pain, unspecified: Secondary | ICD-10-CM

## 2021-02-18 DIAGNOSIS — R197 Diarrhea, unspecified: Secondary | ICD-10-CM

## 2021-02-18 DIAGNOSIS — M2550 Pain in unspecified joint: Secondary | ICD-10-CM

## 2021-02-18 DIAGNOSIS — R634 Abnormal weight loss: Secondary | ICD-10-CM

## 2021-02-18 DIAGNOSIS — F419 Anxiety disorder, unspecified: Secondary | ICD-10-CM

## 2021-02-18 DIAGNOSIS — Z1211 Encounter for screening for malignant neoplasm of colon: Secondary | ICD-10-CM

## 2021-02-18 DIAGNOSIS — G8929 Other chronic pain: Secondary | ICD-10-CM | POA: Diagnosis not present

## 2021-02-18 DIAGNOSIS — R110 Nausea: Secondary | ICD-10-CM | POA: Insufficient documentation

## 2021-02-18 LAB — CBC WITH DIFFERENTIAL/PLATELET
Basophils Absolute: 0 10*3/uL (ref 0.0–0.1)
Basophils Relative: 0.5 % (ref 0.0–3.0)
Eosinophils Absolute: 0 10*3/uL (ref 0.0–0.7)
Eosinophils Relative: 0.4 % (ref 0.0–5.0)
HCT: 42.8 % (ref 36.0–46.0)
Hemoglobin: 14.9 g/dL (ref 12.0–15.0)
Lymphocytes Relative: 31.1 % (ref 12.0–46.0)
Lymphs Abs: 1.8 10*3/uL (ref 0.7–4.0)
MCHC: 34.7 g/dL (ref 30.0–36.0)
MCV: 92.1 fl (ref 78.0–100.0)
Monocytes Absolute: 0.5 10*3/uL (ref 0.1–1.0)
Monocytes Relative: 8.1 % (ref 3.0–12.0)
Neutro Abs: 3.5 10*3/uL (ref 1.4–7.7)
Neutrophils Relative %: 59.9 % (ref 43.0–77.0)
Platelets: 237 10*3/uL (ref 150.0–400.0)
RBC: 4.65 Mil/uL (ref 3.87–5.11)
RDW: 13.9 % (ref 11.5–15.5)
WBC: 5.9 10*3/uL (ref 4.0–10.5)

## 2021-02-18 LAB — H. PYLORI ANTIBODY, IGG: H Pylori IgG: NEGATIVE

## 2021-02-18 LAB — URINALYSIS
Bilirubin Urine: NEGATIVE
Leukocytes,Ua: NEGATIVE
Nitrite: NEGATIVE
Specific Gravity, Urine: 1.025 (ref 1.000–1.030)
Total Protein, Urine: NEGATIVE
Urine Glucose: NEGATIVE
Urobilinogen, UA: 0.2 (ref 0.0–1.0)
pH: 6 (ref 5.0–8.0)

## 2021-02-18 LAB — TSH: TSH: 0.93 u[IU]/mL (ref 0.35–4.50)

## 2021-02-18 LAB — COMPREHENSIVE METABOLIC PANEL
ALT: 17 U/L (ref 0–35)
AST: 19 U/L (ref 0–37)
Albumin: 4.2 g/dL (ref 3.5–5.2)
Alkaline Phosphatase: 71 U/L (ref 39–117)
BUN: 20 mg/dL (ref 6–23)
CO2: 28 mEq/L (ref 19–32)
Calcium: 9.9 mg/dL (ref 8.4–10.5)
Chloride: 102 mEq/L (ref 96–112)
Creatinine, Ser: 0.81 mg/dL (ref 0.40–1.20)
GFR: 70.32 mL/min (ref >60.00)
Glucose, Bld: 87 mg/dL (ref 70–99)
Potassium: 4.7 mEq/L (ref 3.5–5.1)
Sodium: 138 mEq/L (ref 135–145)
Total Bilirubin: 0.4 mg/dL (ref 0.2–1.2)
Total Protein: 7.8 g/dL (ref 6.0–8.3)

## 2021-02-18 LAB — T4, FREE: Free T4: 0.88 ng/dL (ref 0.60–1.60)

## 2021-02-18 LAB — SEDIMENTATION RATE: Sed Rate: 19 mm/hr (ref 0–30)

## 2021-02-18 MED ORDER — TRAMADOL HCL 50 MG PO TABS: 25.0000 mg | ORAL_TABLET | Freq: Four times a day (QID) | ORAL | 0 refills | Status: AC | PRN

## 2021-02-18 NOTE — Assessment & Plan Note (Signed)
Since Dec 2021- resolving H pylori, LFTs

## 2021-02-18 NOTE — Assessment & Plan Note (Signed)
ADD sx's discussed

## 2021-02-18 NOTE — Assessment & Plan Note (Addendum)
Worse Tramadol prn low dose  Potential benefits of a long term opioids use as well as potential risks (i.e. addiction risk, apnea etc) and complications (i.e. Somnolence, constipation and others) were explained to the patient and were aknowledged. RF, ESR

## 2021-02-18 NOTE — Progress Notes (Signed)
Subjective:  Patient ID: Stacy Robinson, female    DOB: [birth year]  Age: 75 y.o. MRN: [id number]  CC: Follow-up (Diarrhea & vomiting sxs BACK in December ) and Joint Swelling (Pain...)   HPI Stacy Robinson presents for diarrhea since Dec 2021 -- resolved C/o arthralgias, LBP - worse Pt lost wt  Outpatient Medications Prior to Visit  Medication Sig Dispense Refill  . b complex vitamins tablet Take 1 tablet by mouth daily. 100 tablet 3  . Cholecalciferol (VITAMIN D3) 50 MCG (2000 UT) capsule Take 1 capsule (2,000 Units total) by mouth daily. 100 capsule 3  . furosemide (LASIX) 20 MG tablet Take 1-2 tablets (20-40 mg total) by mouth daily as needed for edema. 60 tablet 3  . Magnesium 100 MG TABS Take 100 mg by mouth daily.     [name] ALPRAZolam (XANAX) 0.25 MG tablet Take 1-2 tablets (0.25-0.5 mg total) by mouth 2 (two) times daily as needed for anxiety or sleep. (Patient not taking: Reported on 02/18/2021) 30 tablet 1  . diphenhydrAMINE (BENADRYL) 25 MG tablet Take 1-2 tablets (25-50 mg total) by mouth every 4 (four) hours as needed for itching or allergies. (Patient not taking: Reported on 02/18/2021) 100 tablet 3  . ondansetron (ZOFRAN) 4 MG tablet Take 1 tablet (4 mg total) by mouth every 8 (eight) hours as needed for nausea or vomiting. (Patient not taking: Reported on 02/18/2021) 20 tablet 0  . potassium chloride (KLOR-CON) 8 MEQ tablet Take 1 tablet (8 mEq total) by mouth daily. 30 tablet 11   No facility-administered medications prior to visit.    ROS: Review of Systems  Constitutional: Positive for unexpected weight change. Negative for activity change, appetite change, chills and fatigue.  HENT: Negative for congestion, mouth sores and sinus pressure.   Eyes: Negative for visual disturbance.  Respiratory: Negative for cough and chest tightness.   Gastrointestinal: Positive for nausea. Negative for abdominal pain.  Genitourinary: Negative for difficulty urinating, frequency and  vaginal pain.  Musculoskeletal: Positive for arthralgias and back pain. Negative for gait problem.  Skin: Negative for pallor and rash.  Neurological: Negative for dizziness, tremors, weakness, numbness and headaches.  Psychiatric/Behavioral: Negative for confusion, sleep disturbance and suicidal ideas.    Objective:  BP 120/82 (BP Location: Left Arm)   Pulse (!) 107   Temp 98 F (36.7 C) (Oral)   Ht 5\' 8"  (1.727 m)   Wt 167 lb (75.8 kg)   SpO2 99%   BMI 25.39 kg/m   BP Readings from Last 3 Encounters:  02/18/21 120/82  03/21/20 124/80  06/12/19 134/82    Wt Readings from Last 3 Encounters:  02/18/21 167 lb (75.8 kg)  03/21/20 183 lb (83 kg)  06/12/19 179 lb (81.2 kg)    Physical Exam Constitutional:      General: She is not in acute distress.    Appearance: She is well-developed.  HENT:     Head: Normocephalic.     Right Ear: External ear normal.     Left Ear: External ear normal.     Nose: Nose normal.     Mouth/Throat:     Mouth: Oropharynx is clear and moist.  Eyes:     General:        Right eye: No discharge.        Left eye: No discharge.     Conjunctiva/sclera: Conjunctivae normal.     Pupils: Pupils are equal, round, and reactive to light.  Neck:  Thyroid: No thyromegaly.     Vascular: No JVD.     Trachea: No tracheal deviation.  Cardiovascular:     Rate and Rhythm: Normal rate and regular rhythm.     Heart sounds: Normal heart sounds.  Pulmonary:     Effort: No respiratory distress.     Breath sounds: No stridor. No wheezing.  Abdominal:     General: Bowel sounds are normal. There is no distension.     Palpations: Abdomen is soft. There is no mass.     Tenderness: There is no abdominal tenderness. There is no guarding or rebound.  Musculoskeletal:        General: No tenderness or edema.     Cervical back: Normal range of motion and neck supple.  Lymphadenopathy:     Cervical: No cervical adenopathy.  Skin:    Findings: No erythema or  rash.  Neurological:     Mental Status: She is oriented to person, place, and time.     Cranial Nerves: No cranial nerve deficit.     Motor: No abnormal muscle tone.     Coordination: Coordination normal.     Deep Tendon Reflexes: Reflexes normal.  Psychiatric:        Mood and Affect: Mood and affect normal.        Behavior: Behavior normal.        Thought Content: Thought content normal.        Judgment: Judgment normal.     Lab Results  Component Value Date   WBC 4.4 12/13/2020   HGB 14.2 12/13/2020   HCT 39.7 12/13/2020   PLT 207.0 12/13/2020   GLUCOSE 87 12/13/2020   CHOL 254 (H) 06/12/2019   TRIG 152.0 (H) 06/12/2019   HDL 74.60 06/12/2019   LDLDIRECT 113.0 03/13/2013   LDLCALC 149 (H) 06/12/2019   ALT 20 12/13/2020   AST 17 12/13/2020   NA 137 12/13/2020   K 3.9 12/13/2020   CL 104 12/13/2020   CREATININE 0.82 12/13/2020   BUN 12 12/13/2020   CO2 27 12/13/2020   TSH 0.93 03/21/2020   INR 1.03 05/30/2016    CT Abdomen Pelvis W Contrast  Result Date: 04/09/2020 CLINICAL DATA:  Abdominal abscess, infection suspected abdominal pain, bloating, rectal bleeding EXAM: CT ABDOMEN AND PELVIS WITH CONTRAST TECHNIQUE: Multidetector CT imaging of the abdomen and pelvis was performed using the standard protocol following bolus administration of intravenous contrast. CONTRAST:  18mL OMNIPAQUE IOHEXOL 300 MG/ML  SOLN COMPARISON:  None. FINDINGS: Lower chest: 8 mm calcified right lower lobe pulmonary nodule likely reflecting sequela prior granulomatous disease. Hepatobiliary: No focal liver abnormality is seen. No gallstones, gallbladder wall thickening, or biliary dilatation. Pancreas: Unremarkable. No pancreatic ductal dilatation or surrounding inflammatory changes. Spleen: Normal in size without focal abnormality. Adrenals/Urinary Tract: Normal adrenal glands. 2.5 cm hypodense, fluid attenuating left renal mass consistent with a cyst. 10 mm hypodense, fluid attenuating right renal  mass consistent with a cyst. No urolithisasis or obstructive uropathy. Diverticulosis without evidence of diverticulitis. Stomach/Bowel: Stomach is within normal limits. No evidence of bowel wall thickening, distention, or inflammatory changes. Small hiatal hernia. Vascular/Lymphatic: Normal caliber abdominal aorta with mild atherosclerosis. No lymphadenopathy. Reproductive: Status post hysterectomy. No adnexal masses. Other: No abdominal wall hernia or abnormality. No abdominopelvic ascites. Musculoskeletal: No acute osseous abnormality. No aggressive osseous lesion. Bilateral facet arthropathy throughout the lumbar spine. IMPRESSION: 1. No acute abdominal or pelvic pathology. 2. Diverticulosis without evidence of diverticulitis. 3. Aortic Atherosclerosis (ICD10-I70.0). Electronically Signed  By: Kathreen Devoid   On: 04/09/2020 15:20    Assessment & Plan:    Walker Kehr, MD

## 2021-02-18 NOTE — Assessment & Plan Note (Addendum)
Since Dec 2021- resolving H pylori, LFTs GI ref advised - pt declined

## 2021-02-18 NOTE — Addendum Note (Signed)
Addended by: Raliegh Ip on: 02/18/2021 12:22 PM   Modules accepted: Orders

## 2021-02-18 NOTE — Assessment & Plan Note (Addendum)
TSH, FT4 ADD sx's discussed - will not treat GI ref

## 2021-02-18 NOTE — Assessment & Plan Note (Addendum)
Since Dec 2021 H pylori, LFTs GI ref advised - pt declined

## 2021-02-18 NOTE — Addendum Note (Signed)
Addended by: Earnstine Regal on: 02/18/2021 01:59 PM   Modules accepted: Orders

## 2021-02-20 DIAGNOSIS — H26492 Other secondary cataract, left eye: Secondary | ICD-10-CM | POA: Diagnosis not present

## 2021-02-20 DIAGNOSIS — H43822 Vitreomacular adhesion, left eye: Secondary | ICD-10-CM | POA: Diagnosis not present

## 2021-02-20 DIAGNOSIS — Z961 Presence of intraocular lens: Secondary | ICD-10-CM | POA: Diagnosis not present

## 2021-02-20 DIAGNOSIS — H52203 Unspecified astigmatism, bilateral: Secondary | ICD-10-CM | POA: Diagnosis not present

## 2021-02-27 DIAGNOSIS — H26492 Other secondary cataract, left eye: Secondary | ICD-10-CM | POA: Diagnosis not present

## 2021-03-05 LAB — COLOGUARD

## 2021-03-11 ENCOUNTER — Telehealth: Payer: Self-pay | Admitting: Internal Medicine

## 2021-03-11 NOTE — Telephone Encounter (Signed)
Patient sent out her cologuard and they got it after the 72 hour mark so she is wondering if instead of doing the cologuard again if she can just get a colonoscopy done.

## 2021-03-12 LAB — COLOGUARD

## 2021-03-13 NOTE — Telephone Encounter (Signed)
Were they able to analyze the sample or did they have to discarded? Thanks

## 2021-03-13 NOTE — Telephone Encounter (Signed)
Called pt she states [employer] called her and inform her that it was too late to process the cologuard, and they would send her another kit, but pt decline she states she rather go and have the colonscopy...Stacy Robinson

## 2021-03-15 NOTE — Telephone Encounter (Signed)
It would be best for the patient to contact [employer] and ask them to send her another kit for collection.  It is free. Screening colonoscopy is not indicated after the age of 65 and will not be a covered procedure.  Colonoscopy can be done yes after age of 58 for GI symptoms only. Thanks

## 2021-03-17 NOTE — Telephone Encounter (Signed)
Notified pt w/MD response. She states she will go ahead and do the cologuard, but she through the card away. Inform her I will place another order and they will contact her.Marland KitchenJohny Chess

## 2021-03-31 DIAGNOSIS — Z1211 Encounter for screening for malignant neoplasm of colon: Secondary | ICD-10-CM | POA: Diagnosis not present

## 2021-03-31 LAB — COLOGUARD: Cologuard: NEGATIVE

## 2021-04-03 LAB — COLOGUARD: COLOGUARD: NEGATIVE

## 2021-04-14 ENCOUNTER — Encounter: Payer: Self-pay | Admitting: Internal Medicine

## 2021-04-18 ENCOUNTER — Telehealth: Payer: Self-pay | Admitting: *Deleted

## 2021-04-18 ENCOUNTER — Other Ambulatory Visit: Payer: Self-pay

## 2021-04-18 NOTE — Telephone Encounter (Signed)
Notified pt MD receive results back for cologuard and results was NEGATIVE.../LMB

## 2021-04-21 ENCOUNTER — Other Ambulatory Visit: Payer: Self-pay

## 2021-04-21 ENCOUNTER — Ambulatory Visit (INDEPENDENT_AMBULATORY_CARE_PROVIDER_SITE_OTHER): Payer: Medicare HMO | Admitting: Internal Medicine

## 2021-04-21 ENCOUNTER — Encounter: Payer: Self-pay | Admitting: Internal Medicine

## 2021-04-21 VITALS — BP 108/62 | HR 92 | Temp 98.2°F | Ht 68.0 in | Wt 168.0 lb

## 2021-04-21 DIAGNOSIS — E785 Hyperlipidemia, unspecified: Secondary | ICD-10-CM

## 2021-04-21 DIAGNOSIS — R634 Abnormal weight loss: Secondary | ICD-10-CM | POA: Diagnosis not present

## 2021-04-21 DIAGNOSIS — J301 Allergic rhinitis due to pollen: Secondary | ICD-10-CM | POA: Diagnosis not present

## 2021-04-21 DIAGNOSIS — M2550 Pain in unspecified joint: Secondary | ICD-10-CM | POA: Diagnosis not present

## 2021-04-21 DIAGNOSIS — Z0000 Encounter for general adult medical examination without abnormal findings: Secondary | ICD-10-CM

## 2021-04-21 DIAGNOSIS — Z860100 Personal history of colon polyps, unspecified: Secondary | ICD-10-CM

## 2021-04-21 DIAGNOSIS — I700 Atherosclerosis of aorta: Secondary | ICD-10-CM | POA: Diagnosis not present

## 2021-04-21 NOTE — Assessment & Plan Note (Addendum)
Wt Readings from Last 3 Encounters:  04/21/21 168 lb (76.2 kg)  02/18/21 167 lb (75.8 kg)  03/21/20 183 lb (83 kg)   Resolved

## 2021-04-21 NOTE — Assessment & Plan Note (Signed)
On diet, exercise 

## 2021-04-21 NOTE — Assessment & Plan Note (Signed)
Tylenol prn 

## 2021-04-21 NOTE — Assessment & Plan Note (Addendum)
[employer] (-) 2022 Pt declined colonoscopy

## 2021-04-21 NOTE — Assessment & Plan Note (Signed)
Loratadine prn

## 2021-04-21 NOTE — Progress Notes (Signed)
Subjective:  Patient ID: Stacy Robinson, female    DOB: July 15, 1946  Age: 75 y.o. MRN: [id number]  CC: Follow-up (2 month f/u)   HPI Stacy Robinson presents for aortic atherosclerosis, OAB. F/u nausea, wt loss  - resolved  Outpatient Medications Prior to Visit  Medication Sig Dispense Refill  . b complex vitamins tablet Take 1 tablet by mouth daily. 100 tablet 3  . Cholecalciferol (VITAMIN D3) 50 MCG (2000 UT) capsule Take 1 capsule (2,000 Units total) by mouth daily. 100 capsule 3  . ELDERBERRY PO Take 1 capsule by mouth daily.    . furosemide (LASIX) 20 MG tablet Take 1-2 tablets (20-40 mg total) by mouth daily as needed for edema. 60 tablet 3   No facility-administered medications prior to visit.    ROS: Review of Systems  Constitutional: Positive for unexpected weight change. Negative for activity change, appetite change, chills and fatigue.  HENT: Negative for congestion, mouth sores and sinus pressure.   Eyes: Negative for visual disturbance.  Respiratory: Negative for cough and chest tightness.   Gastrointestinal: Negative for abdominal pain and nausea.  Genitourinary: Negative for difficulty urinating, frequency and vaginal pain.  Musculoskeletal: Negative for back pain and gait problem.  Skin: Negative for pallor and rash.  Neurological: Negative for dizziness, tremors, weakness, numbness and headaches.  Psychiatric/Behavioral: Negative for confusion and sleep disturbance.    Objective:  BP 108/62 (BP Location: Left Arm)   Pulse 92   Temp 98.2 F (36.8 C) (Oral)   Ht 5\' 8"  (1.727 m)   Wt 168 lb (76.2 kg)   SpO2 97%   BMI 25.54 kg/m   BP Readings from Last 3 Encounters:  04/21/21 108/62  02/18/21 120/82  03/21/20 124/80    Wt Readings from Last 3 Encounters:  04/21/21 168 lb (76.2 kg)  02/18/21 167 lb (75.8 kg)  03/21/20 183 lb (83 kg)    Physical Exam Constitutional:      General: She is not in acute distress.    Appearance: Normal appearance.  She is well-developed.  HENT:     Head: Normocephalic.     Right Ear: External ear normal.     Left Ear: External ear normal.     Nose: Nose normal.  Eyes:     General:        Right eye: No discharge.        Left eye: No discharge.     Conjunctiva/sclera: Conjunctivae normal.     Pupils: Pupils are equal, round, and reactive to light.  Neck:     Thyroid: No thyromegaly.     Vascular: No JVD.     Trachea: No tracheal deviation.  Cardiovascular:     Rate and Rhythm: Normal rate and regular rhythm.     Heart sounds: Normal heart sounds.  Pulmonary:     Effort: No respiratory distress.     Breath sounds: No stridor. No wheezing.  Abdominal:     General: Bowel sounds are normal. There is no distension.     Palpations: Abdomen is soft. There is no mass.     Tenderness: There is no abdominal tenderness. There is no guarding or rebound.  Musculoskeletal:        General: No tenderness.     Cervical back: Normal range of motion and neck supple.  Lymphadenopathy:     Cervical: No cervical adenopathy.  Skin:    Findings: No erythema or rash.  Neurological:     Mental Status: She  is oriented to person, place, and time.     Cranial Nerves: No cranial nerve deficit.     Motor: No abnormal muscle tone.     Coordination: Coordination normal.     Deep Tendon Reflexes: Reflexes normal.  Psychiatric:        Behavior: Behavior normal.        Thought Content: Thought content normal.        Judgment: Judgment normal.     Lab Results  Component Value Date   WBC 5.9 02/18/2021   HGB 14.9 02/18/2021   HCT 42.8 02/18/2021   PLT 237.0 02/18/2021   GLUCOSE 87 02/18/2021   CHOL 254 (H) 06/12/2019   TRIG 152.0 (H) 06/12/2019   HDL 74.60 06/12/2019   LDLDIRECT 113.0 03/13/2013   LDLCALC 149 (H) 06/12/2019   ALT 17 02/18/2021   AST 19 02/18/2021   NA 138 02/18/2021   K 4.7 02/18/2021   CL 102 02/18/2021   CREATININE 0.81 02/18/2021   BUN 20 02/18/2021   CO2 28 02/18/2021   TSH 0.93  02/18/2021   INR 1.03 05/30/2016    CT Abdomen Pelvis W Contrast  Result Date: 04/09/2020  CLINICAL DATA:  Abdominal abscess, infection suspected abdominal pain, bloating, rectal bleeding EXAM: CT ABDOMEN AND PELVIS WITH CONTRAST TECHNIQUE: Multidetector CT imaging of the abdomen and pelvis was performed using the standard protocol following bolus administration of intravenous contrast. CONTRAST:  171mL OMNIPAQUE IOHEXOL 300 MG/ML  SOLN COMPARISON:  None. FINDINGS: Lower chest: 8 mm calcified right lower lobe pulmonary nodule likely reflecting sequela prior granulomatous disease. Hepatobiliary: No focal liver abnormality is seen. No gallstones, gallbladder wall thickening, or biliary dilatation. Pancreas: Unremarkable. No pancreatic ductal dilatation or surrounding inflammatory changes. Spleen: Normal in size without focal abnormality. Adrenals/Urinary Tract: Normal adrenal glands. 2.5 cm hypodense, fluid attenuating left renal mass consistent with a cyst. 10 mm hypodense, fluid attenuating right renal mass consistent with a cyst. No urolithisasis or obstructive uropathy. Diverticulosis without evidence of diverticulitis. Stomach/Bowel: Stomach is within normal limits. No evidence of bowel wall thickening, distention, or inflammatory changes. Small hiatal hernia. Vascular/Lymphatic: Normal caliber abdominal aorta with mild atherosclerosis. No lymphadenopathy. Reproductive: Status post hysterectomy. No adnexal masses. Other: No abdominal wall hernia or abnormality. No abdominopelvic ascites. Musculoskeletal: No acute osseous abnormality. No aggressive osseous lesion. Bilateral facet arthropathy throughout the lumbar spine. IMPRESSION: 1. No acute abdominal or pelvic pathology. 2. Diverticulosis without evidence of diverticulitis. 3. Aortic Atherosclerosis (ICD10-I70.0). Electronically Signed   By: Kathreen Devoid   On: 04/09/2020 15:20    Assessment & Plan:    Walker Kehr, MD

## 2021-08-19 ENCOUNTER — Other Ambulatory Visit: Payer: Self-pay

## 2021-08-19 ENCOUNTER — Ambulatory Visit (INDEPENDENT_AMBULATORY_CARE_PROVIDER_SITE_OTHER): Payer: Medicare HMO | Admitting: Internal Medicine

## 2021-08-19 ENCOUNTER — Encounter: Payer: Self-pay | Admitting: Internal Medicine

## 2021-08-19 VITALS — BP 112/82 | HR 96 | Temp 97.8°F | Ht 68.0 in | Wt 156.2 lb

## 2021-08-19 DIAGNOSIS — Z0000 Encounter for general adult medical examination without abnormal findings: Secondary | ICD-10-CM

## 2021-08-19 DIAGNOSIS — K5790 Diverticulosis of intestine, part unspecified, without perforation or abscess without bleeding: Secondary | ICD-10-CM | POA: Insufficient documentation

## 2021-08-19 DIAGNOSIS — E785 Hyperlipidemia, unspecified: Secondary | ICD-10-CM | POA: Diagnosis not present

## 2021-08-19 DIAGNOSIS — I700 Atherosclerosis of aorta: Secondary | ICD-10-CM | POA: Diagnosis not present

## 2021-08-19 DIAGNOSIS — K439 Ventral hernia without obstruction or gangrene: Secondary | ICD-10-CM | POA: Diagnosis not present

## 2021-08-19 DIAGNOSIS — R1084 Generalized abdominal pain: Secondary | ICD-10-CM | POA: Diagnosis not present

## 2021-08-19 DIAGNOSIS — R634 Abnormal weight loss: Secondary | ICD-10-CM

## 2021-08-19 MED ORDER — AMOXICILLIN-POT CLAVULANATE 875-125 MG PO TABS: 1.0000 | ORAL_TABLET | Freq: Two times a day (BID) | ORAL | 0 refills | Status: DC

## 2021-08-19 MED ORDER — METAMUCIL 0.52 G PO CAPS: 0.5200 g | ORAL_CAPSULE | Freq: Every day | ORAL | 11 refills | Status: AC

## 2021-08-19 NOTE — Assessment & Plan Note (Addendum)
New R small ventral hernia measuring about 2-1/2 x 1 and half centimeter is palpable in the right lower quadrant, nontender, best felt in the standing position. Dr Maryland Pink performed vaginal hysterectomy, right sacrospinous ligament fixation, mid urethral sling with cystoscopy for stage IV uterine prolapse on 12/19/2015 GYN consult w/Dr Zigmund Daniel

## 2021-08-19 NOTE — Patient Instructions (Signed)

## 2021-08-19 NOTE — Progress Notes (Signed)
Subjective:  Patient ID: Stacy Robinson, female    DOB: 1946/01/01  Age: 75 y.o. MRN: [id number]  CC: Abdominal Pain (Pt states she been having some abdominal pain, and have this growth (R) side pelvic area )   HPI Emeli V Downen presents for abd pain, distension, pain x 1.5 wk. it is better now C/o a growth or hernia in the RLQ No n/v Dr Maryland Pink performed vaginal hysterectomy, right sacrospinous ligament fixation, mid urethral sling with cystoscopy for stage IV uterine prolapse on 12/19/2015   Outpatient Medications Prior to Visit  Medication Sig Dispense Refill   b complex vitamins tablet Take 1 tablet by mouth daily. 100 tablet 3   Cholecalciferol (VITAMIN D3) 50 MCG (2000 UT) capsule Take 1 capsule (2,000 Units total) by mouth daily. 100 capsule 3   ELDERBERRY PO Take 1 capsule by mouth daily.     furosemide (LASIX) 20 MG tablet Take 1-2 tablets (20-40 mg total) by mouth daily as needed for edema. 60 tablet 3   No facility-administered medications prior to visit.   5-day forward ROS: Review of Systems  Constitutional:  Negative for activity change, appetite change, chills, fatigue and unexpected weight change.  HENT:  Negative for congestion, mouth sores and sinus pressure.   Eyes:  Negative for visual disturbance.  Respiratory:  Negative for cough and chest tightness.   Gastrointestinal:  Positive for abdominal distention and abdominal pain. Negative for constipation, nausea and vomiting.  Genitourinary:  Negative for difficulty urinating, frequency and vaginal pain.  Musculoskeletal:  Negative for back pain and gait problem.  Skin:  Negative for pallor and rash.  Neurological:  Negative for dizziness, tremors, weakness, numbness and headaches.  Psychiatric/Behavioral:  Negative for confusion and sleep disturbance.    Objective:  BP 112/82 (BP Location: Left Arm)   Pulse 96   Temp 97.8 F (36.6 C) (Oral)   Ht '5\' 8"'$  (1.727 m)   Wt 156 lb 3.2 oz (70.9 kg)    SpO2 98%   BMI 23.75 kg/m   BP Readings from Last 3 Encounters:  08/19/21 112/82  04/21/21 108/62  02/18/21 120/82    Wt Readings from Last 3 Encounters:  08/19/21 156 lb 3.2 oz (70.9 kg)  04/21/21 168 lb (76.2 kg)  02/18/21 167 lb (75.8 kg)    Physical Exam Constitutional:      General: She is not in acute distress.    Appearance: She is well-developed.  HENT:     Head: Normocephalic.     Right Ear: External ear normal.     Left Ear: External ear normal.     Nose: Nose normal.  Eyes:     General:        Right eye: No discharge.        Left eye: No discharge.     Conjunctiva/sclera: Conjunctivae normal.     Pupils: Pupils are equal, round, and reactive to light.  Neck:     Thyroid: No thyromegaly.     Vascular: No JVD.     Trachea: No tracheal deviation.  Cardiovascular:     Rate and Rhythm: Normal rate and regular rhythm.     Heart sounds: Normal heart sounds.  Pulmonary:     Effort: No respiratory distress.     Breath sounds: No stridor. No wheezing.  Abdominal:     General: Bowel sounds are normal. There is no distension.     Palpations: Abdomen is soft. There is no mass.  Tenderness: There is no abdominal tenderness. There is no guarding or rebound.  Musculoskeletal:        General: No tenderness.     Cervical back: Normal range of motion and neck supple. No rigidity.  Lymphadenopathy:     Cervical: No cervical adenopathy.  Skin:    Findings: No erythema or rash.  Neurological:     Cranial Nerves: No cranial nerve deficit.     Motor: No abnormal muscle tone.     Coordination: Coordination normal.     Deep Tendon Reflexes: Reflexes normal.  Psychiatric:        Behavior: Behavior normal.        Thought Content: Thought content normal.        Judgment: Judgment normal.   R small ventral hernia measuring about 2-1/2 x 1 and half centimeter is palpable in the right lower quadrant, nontender, best felt in the standing position  Lab Results   Component Value Date   WBC 5.9 02/18/2021   HGB 14.9 02/18/2021   HCT 42.8 02/18/2021   PLT 237.0 02/18/2021   GLUCOSE 87 02/18/2021   CHOL 254 (H) 06/12/2019   TRIG 152.0 (H) 06/12/2019   HDL 74.60 06/12/2019   LDLDIRECT 113.0 03/13/2013   LDLCALC 149 (H) 06/12/2019   ALT 17 02/18/2021   AST 19 02/18/2021   NA 138 02/18/2021   K 4.7 02/18/2021   CL 102 02/18/2021   CREATININE 0.81 02/18/2021   BUN 20 02/18/2021   CO2 28 02/18/2021   TSH 0.93 02/18/2021   INR 1.03 05/30/2016    CT Abdomen Pelvis W Contrast  Result Date: 04/09/2020 CLINICAL DATA:  Abdominal abscess, infection suspected abdominal pain, bloating, rectal bleeding EXAM: CT ABDOMEN AND PELVIS WITH CONTRAST TECHNIQUE: Multidetector CT imaging of the abdomen and pelvis was performed using the standard protocol following bolus administration of intravenous contrast. CONTRAST:  15m OMNIPAQUE IOHEXOL 300 MG/ML  SOLN COMPARISON:  None. FINDINGS: Lower chest: 8 mm calcified right lower lobe pulmonary nodule likely reflecting sequela prior granulomatous disease. Hepatobiliary: No focal liver abnormality is seen. No gallstones, gallbladder wall thickening, or biliary dilatation. Pancreas: Unremarkable. No pancreatic ductal dilatation or surrounding inflammatory changes. Spleen: Normal in size without focal abnormality. Adrenals/Urinary Tract: Normal adrenal glands. 2.5 cm hypodense, fluid attenuating left renal mass consistent with a cyst. 10 mm hypodense, fluid attenuating right renal mass consistent with a cyst. No urolithisasis or obstructive uropathy. Diverticulosis without evidence of diverticulitis. Stomach/Bowel: Stomach is within normal limits. No evidence of bowel wall thickening, distention, or inflammatory changes. Small hiatal hernia. Vascular/Lymphatic: Normal caliber abdominal aorta with mild atherosclerosis. No lymphadenopathy. Reproductive: Status post hysterectomy. No adnexal masses. Other: No abdominal wall hernia or  abnormality. No abdominopelvic ascites. Musculoskeletal: No acute osseous abnormality. No aggressive osseous lesion. Bilateral facet arthropathy throughout the lumbar spine. IMPRESSION: 1. No acute abdominal or pelvic pathology. 2. Diverticulosis without evidence of diverticulitis. 3. Aortic Atherosclerosis (ICD10-I70.0). Electronically Signed   By: HKathreen Devoid  On: 04/09/2020 15:20    Assessment & Plan:     AWalker Kehr MD

## 2021-08-19 NOTE — Assessment & Plan Note (Signed)
Metamucil po PO abx if flares up

## 2021-08-19 NOTE — Assessment & Plan Note (Signed)
  On diet  

## 2021-08-20 LAB — CBC WITH DIFFERENTIAL/PLATELET
Basophils Absolute: 0.1 10*3/uL (ref 0.0–0.1)
Basophils Relative: 0.9 % (ref 0.0–3.0)
Eosinophils Absolute: 0 10*3/uL (ref 0.0–0.7)
Eosinophils Relative: 0.7 % (ref 0.0–5.0)
HCT: 42.2 % (ref 36.0–46.0)
Hemoglobin: 14.6 g/dL (ref 12.0–15.0)
Lymphocytes Relative: 33.8 % (ref 12.0–46.0)
Lymphs Abs: 2.1 10*3/uL (ref 0.7–4.0)
MCHC: 34.7 g/dL (ref 30.0–36.0)
MCV: 97.3 fl (ref 78.0–100.0)
Monocytes Absolute: 0.6 10*3/uL (ref 0.1–1.0)
Monocytes Relative: 9.4 % (ref 3.0–12.0)
Neutro Abs: 3.5 10*3/uL (ref 1.4–7.7)
Neutrophils Relative %: 55.2 % (ref 43.0–77.0)
Platelets: 221 10*3/uL (ref 150.0–400.0)
RBC: 4.33 Mil/uL (ref 3.87–5.11)
RDW: 13.4 % (ref 11.5–15.5)
WBC: 6.3 10*3/uL (ref 4.0–10.5)

## 2021-08-20 LAB — COMPREHENSIVE METABOLIC PANEL
ALT: 14 U/L (ref 0–35)
AST: 17 U/L (ref 0–37)
Albumin: 3.9 g/dL (ref 3.5–5.2)
Alkaline Phosphatase: 63 U/L (ref 39–117)
BUN: 18 mg/dL (ref 6–23)
CO2: 25 mEq/L (ref 19–32)
Calcium: 9.9 mg/dL (ref 8.4–10.5)
Chloride: 101 mEq/L (ref 96–112)
Creatinine, Ser: 1.03 mg/dL (ref 0.40–1.20)
GFR: 52.52 mL/min — ABNORMAL LOW (ref >60.00)
Glucose, Bld: 95 mg/dL (ref 70–99)
Potassium: 4.4 mEq/L (ref 3.5–5.1)
Sodium: 138 mEq/L (ref 135–145)
Total Bilirubin: 0.4 mg/dL (ref 0.2–1.2)
Total Protein: 7.5 g/dL (ref 6.0–8.3)

## 2021-08-20 LAB — LIPID PANEL
Cholesterol: 205 mg/dL — ABNORMAL HIGH (ref 0–200)
HDL: 71.7 mg/dL (ref >39.00)
LDL Cholesterol: 111 mg/dL — ABNORMAL HIGH (ref 0–99)
NonHDL: 133.59
Total CHOL/HDL Ratio: 3
Triglycerides: 113 mg/dL (ref 0.0–149.0)
VLDL: 22.6 mg/dL (ref 0.0–40.0)

## 2021-08-20 LAB — TSH: TSH: 1.17 u[IU]/mL (ref 0.35–5.50)

## 2021-08-20 LAB — URINALYSIS
Bilirubin Urine: NEGATIVE
Ketones, ur: NEGATIVE
Leukocytes,Ua: NEGATIVE
Nitrite: NEGATIVE
Specific Gravity, Urine: <=1.005 — AB (ref 1.000–1.030)
Total Protein, Urine: NEGATIVE
Urine Glucose: NEGATIVE
Urobilinogen, UA: 0.2 (ref 0.0–1.0)
pH: 6 (ref 5.0–8.0)

## 2021-08-21 ENCOUNTER — Encounter: Payer: Self-pay | Admitting: Internal Medicine

## 2021-08-21 DIAGNOSIS — R944 Abnormal results of kidney function studies: Secondary | ICD-10-CM | POA: Insufficient documentation

## 2021-09-02 ENCOUNTER — Telehealth: Payer: Self-pay | Admitting: Internal Medicine

## 2021-09-02 DIAGNOSIS — K439 Ventral hernia without obstruction or gangrene: Secondary | ICD-10-CM

## 2021-09-02 NOTE — Telephone Encounter (Signed)
   Dr. Zigmund Daniel declined referral unless patient has vaginal bulge. She states patient should see general surgeon. Do you have a preference for general surgeon?

## 2021-09-08 NOTE — Telephone Encounter (Signed)
Noted.  Please inform the patient.  Well will change the referral to general surgery.  Thanks

## 2021-10-20 ENCOUNTER — Ambulatory Visit: Payer: Medicare HMO

## 2021-10-20 ENCOUNTER — Ambulatory Visit: Payer: Medicare HMO | Admitting: Internal Medicine

## 2021-11-12 ENCOUNTER — Telehealth: Payer: Self-pay | Admitting: Internal Medicine

## 2021-11-12 NOTE — Telephone Encounter (Signed)
Patient states she has been taking 1 pill a day of amoxicillin-clavulanate (AUGMENTIN) 875-125 MG tablet instead of the directed 2 a day  Patient requesting a call back to discuss if she can start taking medication as directed

## 2021-11-13 NOTE — Telephone Encounter (Signed)
Called pt inform to it was fine to start two a day on the amoxillian. Pt agreed and stated she will take another dose this evening.Marland KitchenJohny Robinson

## 2022-05-11 ENCOUNTER — Telehealth: Payer: Self-pay | Admitting: Internal Medicine

## 2022-05-11 ENCOUNTER — Encounter: Payer: Self-pay | Admitting: Internal Medicine

## 2022-05-11 NOTE — Progress Notes (Deleted)
    Subjective:    Patient ID: Stacy Robinson, female    DOB: December 13, 1946, 76 y.o.   MRN: [id number]      HPI Stacy Robinson is here for No chief complaint on file.   Bowel issues - she is having difficulty having a BM - feels like something is blocking it.  She has to push out her feces and it is oozing out.  She has bleeding.  Has had hemorrhoids in the past, but this is different.    Last colonoscopy 2009 - diverticulosis.    Medications and allergies reviewed with patient and updated if appropriate.  Current Outpatient Medications on File Prior to Visit  Medication Sig Dispense Refill   amoxicillin-clavulanate (AUGMENTIN) 875-125 MG tablet Take 1 tablet by mouth 2 (two) times daily. 20 tablet 0   b complex vitamins tablet Take 1 tablet by mouth daily. 100 tablet 3   Cholecalciferol (VITAMIN D3) 50 MCG (2000 UT) capsule Take 1 capsule (2,000 Units total) by mouth daily. 100 capsule 3   ELDERBERRY PO Take 1 capsule by mouth daily.     furosemide (LASIX) 20 MG tablet Take 1-2 tablets (20-40 mg total) by mouth daily as needed for edema. 60 tablet 3   psyllium (METAMUCIL) 0.52 g capsule Take 1 capsule (0.52 g total) by mouth daily. 100 capsule 11   No current facility-administered medications on file prior to visit.    Review of Systems     Objective:  There were no vitals filed for this visit. BP Readings from Last 3 Encounters:  08/19/21 112/82  04/21/21 108/62  02/18/21 120/82   Wt Readings from Last 3 Encounters:  08/19/21 156 lb 3.2 oz (70.9 kg)  04/21/21 168 lb (76.2 kg)  02/18/21 167 lb (75.8 kg)   There is no height or weight on file to calculate BMI.    Physical Exam         Assessment & Plan:    See Problem List for Assessment and Plan of chronic medical problems.

## 2022-05-11 NOTE — Telephone Encounter (Signed)
Pt called in and is  having problems with her biles.  ? ?Pt reports that there is something blocking  her. States there is a Surveyor, quantity" blocking it.  ? ?Reports having to push out feces and it is oozing out. Reports bleeding. Has had a hemorrhoid before, but tthis is different. Reports it being raw. Appointment scheduled with Dr. Quay Burow.  ?

## 2022-05-12 ENCOUNTER — Ambulatory Visit: Payer: Medicare HMO | Admitting: Internal Medicine

## 2022-06-25 DIAGNOSIS — K6289 Other specified diseases of anus and rectum: Secondary | ICD-10-CM | POA: Diagnosis not present

## 2022-06-25 DIAGNOSIS — K5901 Slow transit constipation: Secondary | ICD-10-CM | POA: Diagnosis not present

## 2022-06-25 DIAGNOSIS — Z8742 Personal history of other diseases of the female genital tract: Secondary | ICD-10-CM | POA: Diagnosis not present

## 2022-07-03 ENCOUNTER — Telehealth: Payer: Self-pay | Admitting: Internal Medicine

## 2022-07-03 NOTE — Telephone Encounter (Signed)
Pt is requesting a callback to speak about an appointment she had with the gyn she was referred to. Pt stated she isn't satisfied with the diagnosis from the gyn. She stated it was not consistent with Dr. Enis Slipper' diagnosis. She is wanting to know if she should be referred to a different gyn.  Please advise  CB: [phone]

## 2022-07-06 NOTE — Telephone Encounter (Signed)
PT stated that Dr. Camila Li diagnosed her with a pocket that was filled with fecal matter. She saw a GYN and the GYN stated that it wasn't a pocket it was a hernia. PT does not think it is a hernia and is wanting to know what Dr. Camila Li thinks is going on with her. She said her main concern is finding out what is actually going on with her.   CB: [phone]

## 2022-07-06 NOTE — Telephone Encounter (Signed)
Lvm for pt to call the clinic to discuss her concerns.

## 2022-07-07 NOTE — Telephone Encounter (Signed)
Spoke with pt and was able to sched her with PCP fr Monday, August 1st @ 10:40 am.

## 2022-07-07 NOTE — Telephone Encounter (Signed)
The patient has both problems present that are mentioned in the telephone note: 1. diverticulosis (outpouchings in the sigmoid colon) -most of the left side. 2.  Ventral hernia on the right. Please schedule an office visit if we need to examine her and discuss it further. Thanks

## 2022-07-28 ENCOUNTER — Ambulatory Visit (INDEPENDENT_AMBULATORY_CARE_PROVIDER_SITE_OTHER): Payer: Medicare HMO | Admitting: Internal Medicine

## 2022-07-28 ENCOUNTER — Encounter: Payer: Self-pay | Admitting: Internal Medicine

## 2022-07-28 ENCOUNTER — Ambulatory Visit (INDEPENDENT_AMBULATORY_CARE_PROVIDER_SITE_OTHER): Payer: Medicare HMO

## 2022-07-28 VITALS — BP 118/70 | HR 87 | Temp 97.6°F | Ht 68.0 in | Wt 149.0 lb

## 2022-07-28 DIAGNOSIS — K4090 Unilateral inguinal hernia, without obstruction or gangrene, not specified as recurrent: Secondary | ICD-10-CM

## 2022-07-28 DIAGNOSIS — J301 Allergic rhinitis due to pollen: Secondary | ICD-10-CM

## 2022-07-28 DIAGNOSIS — R42 Dizziness and giddiness: Secondary | ICD-10-CM

## 2022-07-28 DIAGNOSIS — Z0000 Encounter for general adult medical examination without abnormal findings: Secondary | ICD-10-CM | POA: Diagnosis not present

## 2022-07-28 MED ORDER — LEVOCETIRIZINE DIHYDROCHLORIDE 5 MG PO TABS: 5.0000 mg | ORAL_TABLET | Freq: Every evening | ORAL | 3 refills | Status: AC

## 2022-07-28 MED ORDER — TRAMADOL HCL 50 MG PO TABS: 50.0000 mg | ORAL_TABLET | Freq: Four times a day (QID) | ORAL | 0 refills | Status: AC | PRN

## 2022-07-28 NOTE — Assessment & Plan Note (Signed)
R>>L Surgery ref

## 2022-07-28 NOTE — Patient Instructions (Addendum)
Stacy Robinson , Thank you for taking time to come for your Medicare Wellness Visit. I appreciate your ongoing commitment to your health goals. Please review the following plan we discussed and let me know if I can assist you in the future.   Screening recommendations/referrals: Colonoscopy: declined Cologuard: 03/31/2021; due every 3 years Mammogram: declined Bone Density: declined Recommended yearly ophthalmology/optometry visit for glaucoma screening and checkup Recommended yearly dental visit for hygiene and checkup  Vaccinations: Influenza vaccine: declined Pneumococcal vaccine: declined Tdap vaccine: declined Shingles vaccine: declined   Covid-19:declined  Advanced directives: No  Conditions/risks identified: Yes  Next appointment: Please schedule your next Medicare Wellness Visit with your Nurse Health Advisor in 1 year by calling [phone].   Preventive Care 78 Years and Older, Female Preventive care refers to lifestyle choices and visits with your health care provider that can promote health and wellness. What does preventive care include? A yearly physical exam. This is also called an annual well check. Dental exams once or twice a year. Routine eye exams. Ask your health care provider how often you should have your eyes checked. Personal lifestyle choices, including: Daily care of your teeth and gums. Regular physical activity. Eating a healthy diet. Avoiding tobacco and drug use. Limiting alcohol use. Practicing safe sex. Taking low-dose aspirin every day. Taking vitamin and mineral supplements as recommended by your health care provider. What happens during an annual well check? The services and screenings done by your health care provider during your annual well check will depend on your age, overall health, lifestyle risk factors, and family history of disease. Counseling  Your health care provider may ask you questions about your: Alcohol use. Tobacco  use. Drug use. Emotional well-being. Home and relationship well-being. Sexual activity. Eating habits. History of falls. Memory and ability to understand (cognition). Work and work Statistician. Reproductive health. Screening  You may have the following tests or measurements: Height, weight, and BMI. Blood pressure. Lipid and cholesterol levels. These may be checked every 5 years, or more frequently if you are over 78 years old. Skin check. Lung cancer screening. You may have this screening every year starting at age 8 if you have a 30-pack-year history of smoking and currently smoke or have quit within the past 15 years. Fecal occult blood test (FOBT) of the stool. You may have this test every year starting at age 65. Flexible sigmoidoscopy or colonoscopy. You may have a sigmoidoscopy every 5 years or a colonoscopy every 10 years starting at age 19. Hepatitis C blood test. Hepatitis B blood test. Sexually transmitted disease (STD) testing. Diabetes screening. This is done by checking your blood sugar (glucose) after you have not eaten for a while (fasting). You may have this done every 1-3 years. Bone density scan. This is done to screen for osteoporosis. You may have this done starting at age 54. Mammogram. This may be done every 1-2 years. Talk to your health care provider about how often you should have regular mammograms. Talk with your health care provider about your test results, treatment options, and if necessary, the need for more tests. Vaccines  Your health care provider may recommend certain vaccines, such as: Influenza vaccine. This is recommended every year. Tetanus, diphtheria, and acellular pertussis (Tdap, Td) vaccine. You may need a Td booster every 10 years. Zoster vaccine. You may need this after age 29. Pneumococcal 13-valent conjugate (PCV13) vaccine. One dose is recommended after age 55. Pneumococcal polysaccharide (PPSV23) vaccine. One dose is recommended  after  age 12. Talk to your health care provider about which screenings and vaccines you need and how often you need them. This information is not intended to replace advice given to you by your health care provider. Make sure you discuss any questions you have with your health care provider. Document Released: 01/10/2016 Document Revised: 09/02/2016 Document Reviewed: 10/15/2015 Elsevier Interactive Patient Education  2017 [place] Prevention in the Home Falls can cause injuries. They can happen to people of all ages. There are many things you can do to make your home safe and to help prevent falls. What can I do on the outside of my home? Regularly fix the edges of walkways and driveways and fix any cracks. Remove anything that might make you trip as you walk through a door, such as a raised step or threshold. Trim any bushes or trees on the path to your home. Use bright outdoor lighting. Clear any walking paths of anything that might make someone trip, such as rocks or tools. Regularly check to see if handrails are loose or broken. Make sure that both sides of any steps have handrails. Any raised decks and porches should have guardrails on the edges. Have any leaves, snow, or ice cleared regularly. Use sand or salt on walking paths during winter. Clean up any spills in your garage right away. This includes oil or grease spills. What can I do in the bathroom? Use night lights. Install grab bars by the toilet and in the tub and shower. Do not use towel bars as grab bars. Use non-skid mats or decals in the tub or shower. If you need to sit down in the shower, use a plastic, non-slip stool. Keep the floor dry. Clean up any water that spills on the floor as soon as it happens. Remove soap buildup in the tub or shower regularly. Attach bath mats securely with double-sided non-slip rug tape. Do not have throw rugs and other things on the floor that can make you trip. What can I do  in the bedroom? Use night lights. Make sure that you have a light by your bed that is easy to reach. Do not use any sheets or blankets that are too big for your bed. They should not hang down onto the floor. Have a firm chair that has side arms. You can use this for support while you get dressed. Do not have throw rugs and other things on the floor that can make you trip. What can I do in the kitchen? Clean up any spills right away. Avoid walking on wet floors. Keep items that you use a lot in easy-to-reach places. If you need to reach something above you, use a strong step stool that has a grab bar. Keep electrical cords out of the way. Do not use floor polish or wax that makes floors slippery. If you must use wax, use non-skid floor wax. Do not have throw rugs and other things on the floor that can make you trip. What can I do with my stairs? Do not leave any items on the stairs. Make sure that there are handrails on both sides of the stairs and use them. Fix handrails that are broken or loose. Make sure that handrails are as long as the stairways. Check any carpeting to make sure that it is firmly attached to the stairs. Fix any carpet that is loose or worn. Avoid having throw rugs at the top or bottom of the stairs. If you do have  throw rugs, attach them to the floor with carpet tape. Make sure that you have a light switch at the top of the stairs and the bottom of the stairs. If you do not have them, ask someone to add them for you. What else can I do to help prevent falls? Wear shoes that: Do not have high heels. Have rubber bottoms. Are comfortable and fit you well. Are closed at the toe. Do not wear sandals. If you use a stepladder: Make sure that it is fully opened. Do not climb a closed stepladder. Make sure that both sides of the stepladder are locked into place. Ask someone to hold it for you, if possible. Clearly mark and make sure that you can see: Any grab bars or  handrails. First and last steps. Where the edge of each step is. Use tools that help you move around (mobility aids) if they are needed. These include: Canes. Walkers. Scooters. Crutches. Turn on the lights when you go into a dark area. Replace any light bulbs as soon as they burn out. Set up your furniture so you have a clear path. Avoid moving your furniture around. If any of your floors are uneven, fix them. If there are any pets around you, be aware of where they are. Review your medicines with your doctor. Some medicines can make you feel dizzy. This can increase your chance of falling. Ask your doctor what other things that you can do to help prevent falls. This information is not intended to replace advice given to you by your health care provider. Make sure you discuss any questions you have with your health care provider. Document Released: 10/10/2009 Document Revised: 05/21/2016 Document Reviewed: 01/18/2015 Elsevier Interactive Patient Education  2017 [employer].

## 2022-07-28 NOTE — Progress Notes (Signed)
Subjective:  Patient ID: Stacy Robinson, female    DOB: 03-Jan-1946  Age: 76 y.o. MRN: [id number]  CC: No chief complaint on file.   HPI  Shawan V Orbach presents for abd pain and swelling. Pt saw her GYN Dr Zigmund Daniel 1 month ago  C/o vertigo, ear congestion    Outpatient Medications Prior to Visit  Medication Sig Dispense Refill   b complex vitamins tablet Take 1 tablet by mouth daily. 100 tablet 3   Cholecalciferol (VITAMIN D3) 50 MCG (2000 UT) capsule Take 1 capsule (2,000 Units total) by mouth daily. 100 capsule 3   ELDERBERRY PO Take 1 capsule by mouth daily.     furosemide (LASIX) 20 MG tablet Take 1-2 tablets (20-40 mg total) by mouth daily as needed for edema. (Patient not taking: Reported on 07/28/2022) 60 tablet 3   psyllium (METAMUCIL) 0.52 g capsule Take 1 capsule (0.52 g total) by mouth daily. (Patient not taking: Reported on 07/28/2022) 100 capsule 11   amoxicillin-clavulanate (AUGMENTIN) 875-125 MG tablet Take 1 tablet by mouth 2 (two) times daily. 20 tablet 0   No facility-administered medications prior to visit.    ROS: Review of Systems  Constitutional:  Negative for activity change, appetite change, chills, fatigue and unexpected weight change.  HENT:  Negative for congestion, mouth sores and sinus pressure.   Eyes:  Negative for visual disturbance.  Respiratory:  Negative for cough and chest tightness.   Gastrointestinal:  Positive for abdominal pain. Negative for nausea, rectal pain and vomiting.  Genitourinary:  Negative for difficulty urinating, frequency and vaginal pain.  Musculoskeletal:  Negative for back pain and gait problem.  Skin:  Negative for pallor and rash.  Neurological:  Negative for dizziness, tremors, weakness, numbness and headaches.  Psychiatric/Behavioral:  Negative for confusion and sleep disturbance.     Objective:  Ht '5\' 8"'$  (1.727 m)   BMI 22.66 kg/m   BP Readings from Last 3 Encounters:  07/28/22 118/70  08/19/21 112/82   04/21/21 108/62    Wt Readings from Last 3 Encounters:  07/28/22 149 lb (67.6 kg)  08/19/21 156 lb 3.2 oz (70.9 kg)  04/21/21 168 lb (76.2 kg)    Physical Exam Constitutional:      General: She is not in acute distress.    Appearance: Normal appearance. She is well-developed.  HENT:     Head: Normocephalic.     Right Ear: External ear normal.     Left Ear: External ear normal.     Nose: Nose normal.  Eyes:     General:        Right eye: No discharge.        Left eye: No discharge.     Conjunctiva/sclera: Conjunctivae normal.     Pupils: Pupils are equal, round, and reactive to light.  Neck:     Thyroid: No thyromegaly.     Vascular: No JVD.     Trachea: No tracheal deviation.  Cardiovascular:     Rate and Rhythm: Normal rate and regular rhythm.     Heart sounds: Normal heart sounds.  Pulmonary:     Effort: No respiratory distress.     Breath sounds: No stridor. No wheezing.  Abdominal:     General: Bowel sounds are normal. There is no distension.     Palpations: Abdomen is soft. There is no mass.     Tenderness: There is no abdominal tenderness. There is no guarding or rebound.     Hernia: A hernia  is present.  Musculoskeletal:        General: No tenderness.     Cervical back: Normal range of motion and neck supple. No rigidity.  Lymphadenopathy:     Cervical: No cervical adenopathy.  Skin:    Findings: No erythema or rash.  Neurological:     Cranial Nerves: No cranial nerve deficit.     Motor: No abnormal muscle tone.     Coordination: Coordination normal.     Deep Tendon Reflexes: Reflexes normal.  Psychiatric:        Behavior: Behavior normal.        Thought Content: Thought content normal.        Judgment: Judgment normal.    R inguinal hernia >> L  Lab Results  Component Value Date   WBC 6.3 08/19/2021   HGB 14.6 08/19/2021   HCT 42.2 08/19/2021   PLT 221.0 08/19/2021   GLUCOSE 95 08/19/2021   CHOL 205 (H) 08/19/2021   TRIG 113.0 08/19/2021    HDL 71.70 08/19/2021   LDLDIRECT 113.0 03/13/2013   LDLCALC 111 (H) 08/19/2021   ALT 14 08/19/2021   AST 17 08/19/2021   NA 138 08/19/2021   K 4.4 08/19/2021   CL 101 08/19/2021   CREATININE 1.03 08/19/2021   BUN 18 08/19/2021   CO2 25 08/19/2021   TSH 1.17 08/19/2021   INR 1.03 05/30/2016    CT Abdomen Pelvis W Contrast  Result Date: 04/09/2020 CLINICAL DATA:  Abdominal abscess, infection suspected abdominal pain, bloating, rectal bleeding EXAM: CT ABDOMEN AND PELVIS WITH CONTRAST TECHNIQUE: Multidetector CT imaging of the abdomen and pelvis was performed using the standard protocol following bolus administration of intravenous contrast. CONTRAST:  154m OMNIPAQUE IOHEXOL 300 MG/ML  SOLN COMPARISON:  None. FINDINGS: Lower chest: 8 mm calcified right lower lobe pulmonary nodule likely reflecting sequela prior granulomatous disease. Hepatobiliary: No focal liver abnormality is seen. No gallstones, gallbladder wall thickening, or biliary dilatation. Pancreas: Unremarkable. No pancreatic ductal dilatation or surrounding inflammatory changes. Spleen: Normal in size without focal abnormality. Adrenals/Urinary Tract: Normal adrenal glands. 2.5 cm hypodense, fluid attenuating left renal mass consistent with a cyst. 10 mm hypodense, fluid attenuating right renal mass consistent with a cyst. No urolithisasis or obstructive uropathy. Diverticulosis without evidence of diverticulitis. Stomach/Bowel: Stomach is within normal limits. No evidence of bowel wall thickening, distention, or inflammatory changes. Small hiatal hernia. Vascular/Lymphatic: Normal caliber abdominal aorta with mild atherosclerosis. No lymphadenopathy. Reproductive: Status post hysterectomy. No adnexal masses. Other: No abdominal wall hernia or abnormality. No abdominopelvic ascites. Musculoskeletal: No acute osseous abnormality. No aggressive osseous lesion. Bilateral facet arthropathy throughout the lumbar spine. IMPRESSION: 1. No acute  abdominal or pelvic pathology. 2. Diverticulosis without evidence of diverticulitis. 3. Aortic Atherosclerosis (ICD10-I70.0). Electronically Signed   By: HKathreen Devoid  On: 04/09/2020 15:20    Assessment & Plan:   Problem List Items Addressed This Visit     Allergic rhinitis    Try Xyzal qd      Inguinal hernia    R>>L Surgery ref      Relevant Orders   Ambulatory referral to General Surgery   Vertigo     Treat allergies Benign Positional Vertigo symptoms Start BLaruth Bouchard- Daroff exercise several times a day as dirrected.         Meds ordered this encounter  Medications   levocetirizine (XYZAL) 5 MG tablet    Sig: Take 1 tablet (5 mg total) by mouth every evening.    Dispense:  100 tablet    Refill:  3      Follow-up: Return in about 3 months (around 10/28/2022) for a follow-up visit.  Walker Kehr, MD

## 2022-07-28 NOTE — Progress Notes (Addendum)
Subjective:   Stacy Robinson is a 76 y.o. female who presents for Medicare Annual (Subsequent) preventive examination.  Review of Systems     Cardiac Risk Factors include: advanced age (>22mn, >>47women);family history of premature cardiovascular disease     Objective:    Today's Vitals   07/28/22 0947  BP: 118/70  Pulse: 87  Temp: 97.6 F (36.4 C)  SpO2: 97%  Weight: 149 lb (67.6 kg)  Height: '5\' 8"'$  (1.727 m)  PainSc: 0-No pain   Body mass index is 22.66 kg/m.     07/28/2022   10:24 AM  Advanced Directives  Does Patient Have a Medical Advance Directive? Yes  Type of Advance Directive Living will;Healthcare Power of Attorney  Does patient want to make changes to medical advance directive? No - Patient declined  [place] of [place]? No - copy requested    Current Medications (verified) Outpatient Encounter Medications as of 07/28/2022  Medication Sig   b complex vitamins tablet Take 1 tablet by mouth daily.   Cholecalciferol (VITAMIN D3) 50 MCG (2000 UT) capsule Take 1 capsule (2,000 Units total) by mouth daily.   ELDERBERRY PO Take 1 capsule by mouth daily.   furosemide (LASIX) 20 MG tablet Take 1-2 tablets (20-40 mg total) by mouth daily as needed for edema. (Patient not taking: Reported on 07/28/2022)   psyllium (METAMUCIL) 0.52 g capsule Take 1 capsule (0.52 g total) by mouth daily. (Patient not taking: Reported on 07/28/2022)   [DISCONTINUED] amoxicillin-clavulanate (AUGMENTIN) 875-125 MG tablet Take 1 tablet by mouth 2 (two) times daily.   No facility-administered encounter medications on file as of 07/28/2022.    Allergies (verified) Metronidazole, Vigamox [moxifloxacin], Ciprofloxacin, Hctz [hydrochlorothiazide], Sodium sulfate, and Sulfamethoxazole   History: History reviewed. No pertinent past medical history. Past Surgical History:  Procedure Laterality Date   ABDOMINAL HYSTERECTOMY     Family History  Problem Relation Age of  Onset   Heart disease Mother    Heart disease Maternal Grandmother    Social History   Socioeconomic History   Marital status: Widowed    Spouse name: Not on file   Number of children: Not on file   Years of education: Not on file   Highest education level: Not on file  Occupational History   Not on file  Tobacco Use   Smoking status: Former   Smokeless tobacco: Never  Substance and Sexual Activity   Alcohol use: Yes    Alcohol/week: 0.0 standard drinks of alcohol   Drug use: No   Sexual activity: Yes  Other Topics Concern   Not on file  Social History Narrative   Not on file   Social Determinants of Health   Financial Resource Strain: Low Risk  (07/28/2022)   Overall Financial Resource Strain (CARDIA)    Difficulty of Paying Living Expenses: Not hard at all  Food Insecurity: No Food Insecurity (07/28/2022)   Hunger Vital Sign    Worried About Running Out of Food in the Last Year: Never true    Ran Out of Food in the Last Year: Never true  Transportation Needs: No Transportation Needs (07/28/2022)   PRAPARE - THydrologist(Medical): No    Lack of Transportation (Non-Medical): No  Physical Activity: Sufficiently Active (07/28/2022)   Exercise Vital Sign    Days of Exercise per Week: 5 days    Minutes of Exercise per Session: 30 min  Stress: No Stress Concern Present (  07/28/2022)   [place]    Feeling of Stress : Not at all  Social Connections: Moderately Integrated (07/28/2022)   Social Connection and Isolation Panel [NHANES]    Frequency of Communication with Friends and Family: More than three times a week    Frequency of Social Gatherings with Friends and Family: Three times a week    Attends Religious Services: 1 to 4 times per year    Active Member of Clubs or Organizations: No    Attends Archivist Meetings: 1 to 4 times per year    Marital Status: Widowed     Tobacco Counseling Counseling given: Not Answered   Clinical Intake:  Pre-visit preparation completed: Yes  Pain : No/denies pain Pain Score: 0-No pain     BMI - recorded: 22.66 Nutritional Status: BMI of 19-24  Normal Diabetes: No  How often do you need to have someone help you when you read instructions, pamphlets, or other written materials from your doctor or pharmacy?: 1 - Never What is the last grade level you completed in school?: HSG; some college  Diabetic? no  Interpreter Needed?: No  Information entered by :: Lisette Abu, LPN   Activities of Daily Living    07/28/2022   10:25 AM  In your present state of health, do you have any difficulty performing the following activities:  Hearing? 1  Vision? 0  Difficulty concentrating or making decisions? 0  Walking or climbing stairs? 0  Dressing or bathing? 0  Doing errands, shopping? 0  Preparing Food and eating ? N  Using the Toilet? N  In the past six months, have you accidently leaked urine? Y  Do you have problems with loss of bowel control? N  Managing your Medications? N  Managing your Finances? N  Housekeeping or managing your Housekeeping? N    Patient Care Team: Plotnikov, Evie Lacks, MD as PCP - General Dingeldein, Remo Lipps, MD (Ophthalmology) Marti Sleigh, MD as Consulting Physician (Obstetrics and Gynecology) Katy Apo, MD as Consulting Physician (Ophthalmology)  Indicate any recent Medical Services you may have received from other than [facility] providers in the past year (date may be approximate).     Assessment:   This is a routine wellness examination for Stacy Robinson.  Hearing/Vision screen Hearing Screening - Comments:: Patient has hearing difficulty. No hearing aids. Vision Screening - Comments:: Patient does wear corrective lenses/contacts.  Eye exam done by: Katy Apo, MD.   Dietary issues and exercise activities discussed: Current Exercise Habits: Home exercise  routine, Type of exercise: walking;stretching;strength training/weights, Time (Minutes): 30, Frequency (Times/Week): 5, Weekly Exercise (Minutes/Week): 150, Intensity: Moderate, Exercise limited by: None identified   Goals Addressed             This Visit's Progress    To keep breathing.        Depression Screen    07/28/2022    9:48 AM 02/18/2021   11:32 AM 09/07/2017    9:15 AM  PHQ 2/9 Scores  PHQ - 2 Score 0 0 0    Fall Risk    07/28/2022    9:48 AM 02/18/2021   11:32 AM 09/07/2017    9:15 AM 03/09/2016   11:27 AM  Fall Risk   Falls in the past year? 0 0 No No  Number falls in past yr: 0 0    Injury with Fall? 0 0    Risk for fall due to : No Fall Risks  No Fall Risks    Follow up Falls evaluation completed       FALL RISK PREVENTION PERTAINING TO THE HOME:  Any stairs in or around the home? No  If so, are there any without handrails? No  Home free of loose throw rugs in walkways, pet beds, electrical cords, etc? Yes  Adequate lighting in your home to reduce risk of falls? Yes   ASSISTIVE DEVICES UTILIZED TO PREVENT FALLS:  Life alert? No  Use of a cane, walker or w/c? No  Grab bars in the bathroom? Yes  Shower chair or bench in shower? Yes  Elevated toilet seat or a handicapped toilet? Yes   TIMED UP AND GO:  Was the test performed? Yes .  Length of time to ambulate 10 feet: 6 sec.   Gait steady and fast without use of assistive device  Cognitive Function:        07/28/2022   10:26 AM  6CIT Screen  What Year? 0 points  What month? 0 points  What time? 0 points  Count back from 20 0 points  Months in reverse 0 points  Repeat phrase 0 points  Total Score 0 points    Immunizations Immunization History  Administered Date(s) Administered   Influenza-Unspecified 10/28/2018   Td 12/28/2009    TDAP status: declined  Flu Vaccine status: Declined, Education has been provided regarding the importance of this vaccine but patient still declined. Advised  may receive this vaccine at local pharmacy or Health Dept. Aware to provide a copy of the vaccination record if obtained from local pharmacy or Health Dept. Verbalized acceptance and understanding.  Pneumococcal vaccine status: Declined,  Education has been provided regarding the importance of this vaccine but patient still declined. Advised may receive this vaccine at local pharmacy or Health Dept. Aware to provide a copy of the vaccination record if obtained from local pharmacy or Health Dept. Verbalized acceptance and understanding.   Covid-19 vaccine status: Declined, Education has been provided regarding the importance of this vaccine but patient still declined. Advised may receive this vaccine at local pharmacy or Health Dept.or vaccine clinic. Aware to provide a copy of the vaccination record if obtained from local pharmacy or Health Dept. Verbalized acceptance and understanding.  Qualifies for Shingles Vaccine? Yes   Zostavax completed No   Shingrix Completed?: No.    Education has been provided regarding the importance of this vaccine. Patient has been advised to call insurance company to determine out of pocket expense if they have not yet received this vaccine. Advised may also receive vaccine at local pharmacy or Health Dept. Verbalized acceptance and understanding.  Screening Tests Health Maintenance  Topic Date Due   Hepatitis C Screening  Never done   Zoster Vaccines- Shingrix (1 of 2) Never done   Pneumonia Vaccine 36+ Years old (1 - PCV) Never done   DEXA SCAN  Never done   TETANUS/TDAP  12/29/2019   INFLUENZA VACCINE  07/28/2022   HPV VACCINES  Aged Out   COLONOSCOPY (Pts 45-21yr Insurance coverage will need to be confirmed)  Discontinued   COVID-19 Vaccine  Discontinued    Health Maintenance  Health Maintenance Due  Topic Date Due   Hepatitis C Screening  Never done   Zoster Vaccines- Shingrix (1 of 2) Never done   Pneumonia Vaccine 78 Years old (1 - PCV) Never done    DEXA SCAN  Never done   TETANUS/TDAP  12/29/2019   INFLUENZA VACCINE  07/28/2022  Colorectal cancer screening: No longer required.   Mammogram status: No longer required due to refusal.  Bone Density status: declined  Lung Cancer Screening: (Low Dose CT Chest recommended if Age 68-80 years, 30 pack-year currently smoking OR have quit w/in 15years.) does not qualify.   Lung Cancer Screening Referral: no  Additional Screening:  Hepatitis C Screening: does qualify; Completed no  Vision Screening: Recommended annual ophthalmology exams for early detection of glaucoma and other disorders of the eye. Is the patient up to date with their annual eye exam?  Yes  Who is the provider or what is the name of the office in which the patient attends annual eye exams? Katy Apo, MD. If pt is not established with a provider, would they like to be referred to a provider to establish care? No .   Dental Screening: Recommended annual dental exams for proper oral hygiene  Community Resource Referral / Chronic Care Management: CRR required this visit?  No   CCM required this visit?  No      Plan:     I have personally reviewed and noted the following in the patient's chart:   Medical and social history Use of alcohol, tobacco or illicit drugs  Current medications and supplements including opioid prescriptions.  Functional ability and status Nutritional status Physical activity Advanced directives List of other physicians Hospitalizations, surgeries, and ER visits in previous 12 months Vitals Screenings to include cognitive, depression, and falls Referrals and appointments  In addition, I have reviewed and discussed with patient certain preventive protocols, quality metrics, and best practice recommendations. A written personalized care plan for preventive services as well as general preventive health recommendations were provided to patient.     Sheral Flow,  LPN   07/03/8241   Nurse Notes:  Hearing Screening - Comments:: Patient has hearing difficulty. No hearing aids. Vision Screening - Comments:: Patient does wear corrective lenses/contacts.  Eye exam done by: Katy Apo, MD.    Medical screening examination/treatment/procedure(s) were performed by non-physician practitioner and as supervising physician I was immediately available for consultation/collaboration.  I agree with above. Lew Dawes, MD

## 2022-07-28 NOTE — Assessment & Plan Note (Addendum)
  Treat allergies Benign Positional Vertigo symptoms Start Stacy Robinson - [place] exercise several times a day as dirrected.

## 2022-07-28 NOTE — Assessment & Plan Note (Signed)
Try Xyzal qd

## 2022-08-14 ENCOUNTER — Other Ambulatory Visit: Payer: Self-pay | Admitting: Internal Medicine

## 2023-03-05 ENCOUNTER — Encounter: Payer: Medicare HMO | Admitting: Internal Medicine

## 2023-03-24 ENCOUNTER — Ambulatory Visit (INDEPENDENT_AMBULATORY_CARE_PROVIDER_SITE_OTHER): Payer: Medicare HMO | Admitting: Internal Medicine

## 2023-03-24 ENCOUNTER — Encounter: Payer: Self-pay | Admitting: Internal Medicine

## 2023-03-24 VITALS — BP 122/78 | HR 76 | Temp 97.9°F | Ht 68.0 in | Wt 155.0 lb

## 2023-03-24 DIAGNOSIS — N3281 Overactive bladder: Secondary | ICD-10-CM

## 2023-03-24 DIAGNOSIS — Z0000 Encounter for general adult medical examination without abnormal findings: Secondary | ICD-10-CM | POA: Diagnosis not present

## 2023-03-24 DIAGNOSIS — E785 Hyperlipidemia, unspecified: Secondary | ICD-10-CM | POA: Diagnosis not present

## 2023-03-24 LAB — CBC WITH DIFFERENTIAL/PLATELET
Basophils Absolute: 0 10*3/uL (ref 0.0–0.1)
Basophils Relative: 0.5 % (ref 0.0–3.0)
Eosinophils Absolute: 0 10*3/uL (ref 0.0–0.7)
Eosinophils Relative: 0.5 % (ref 0.0–5.0)
HCT: 42.8 % (ref 36.0–46.0)
Hemoglobin: 14.7 g/dL (ref 12.0–15.0)
Lymphocytes Relative: 41.1 % (ref 12.0–46.0)
Lymphs Abs: 2.5 10*3/uL (ref 0.7–4.0)
MCHC: 34.4 g/dL (ref 30.0–36.0)
MCV: 93.9 fl (ref 78.0–100.0)
Monocytes Absolute: 0.5 10*3/uL (ref 0.1–1.0)
Monocytes Relative: 8.4 % (ref 3.0–12.0)
Neutro Abs: 3.1 10*3/uL (ref 1.4–7.7)
Neutrophils Relative %: 49.5 % (ref 43.0–77.0)
Platelets: 210 10*3/uL (ref 150.0–400.0)
RBC: 4.55 Mil/uL (ref 3.87–5.11)
RDW: 12.8 % (ref 11.5–15.5)
WBC: 6.2 10*3/uL (ref 4.0–10.5)

## 2023-03-24 LAB — COMPREHENSIVE METABOLIC PANEL
ALT: 21 U/L (ref 0–35)
AST: 22 U/L (ref 0–37)
Albumin: 4.2 g/dL (ref 3.5–5.2)
Alkaline Phosphatase: 59 U/L (ref 39–117)
BUN: 19 mg/dL (ref 6–23)
CO2: 28 mEq/L (ref 19–32)
Calcium: 9.8 mg/dL (ref 8.4–10.5)
Chloride: 102 mEq/L (ref 96–112)
Creatinine, Ser: 0.72 mg/dL (ref 0.40–1.20)
GFR: 79.81 mL/min (ref >60.00)
Glucose, Bld: 81 mg/dL (ref 70–99)
Potassium: 4.4 mEq/L (ref 3.5–5.1)
Sodium: 137 mEq/L (ref 135–145)
Total Bilirubin: 0.5 mg/dL (ref 0.2–1.2)
Total Protein: 7.3 g/dL (ref 6.0–8.3)

## 2023-03-24 LAB — URINALYSIS
Bilirubin Urine: NEGATIVE
Leukocytes,Ua: NEGATIVE
Nitrite: NEGATIVE
Specific Gravity, Urine: 1.015 (ref 1.000–1.030)
Total Protein, Urine: NEGATIVE
Urine Glucose: NEGATIVE
Urobilinogen, UA: 0.2 (ref 0.0–1.0)
pH: 6 (ref 5.0–8.0)

## 2023-03-24 LAB — LIPID PANEL
Cholesterol: 221 mg/dL — ABNORMAL HIGH (ref 0–200)
HDL: 85 mg/dL (ref >39.00)
LDL Cholesterol: 112 mg/dL — ABNORMAL HIGH (ref 0–99)
NonHDL: 136.44
Total CHOL/HDL Ratio: 3
Triglycerides: 124 mg/dL (ref 0.0–149.0)
VLDL: 24.8 mg/dL (ref 0.0–40.0)

## 2023-03-24 LAB — TSH: TSH: 0.68 u[IU]/mL (ref 0.35–5.50)

## 2023-03-24 MED ORDER — ALPRAZOLAM 0.25 MG PO TABS: ORAL_TABLET | ORAL | 3 refills | Status: AC

## 2023-03-24 NOTE — Progress Notes (Signed)
Subjective:  Patient ID: Stacy Robinson, female    DOB: 1946/09/22  Age: 77 y.o. MRN: [id number]  CC: Annual Exam (physical)   HPI Stacy Robinson presents for well exam  Outpatient Medications Prior to Visit  Medication Sig Dispense Refill   ALPRAZolam (XANAX) 0.25 MG tablet TAKE 1-2 TABLETS TWICE DAILY AS NEEDED FOR ANXIETY OR SLEEP. 30 tablet 3   b complex vitamins tablet Take 1 tablet by mouth daily. 100 tablet 3   Cholecalciferol (VITAMIN D3) 50 MCG (2000 UT) capsule Take 1 capsule (2,000 Units total) by mouth daily. 100 capsule 3   ELDERBERRY PO Take 1 capsule by mouth daily.     levocetirizine (XYZAL) 5 MG tablet Take 1 tablet (5 mg total) by mouth every evening. 100 tablet 3   furosemide (LASIX) 20 MG tablet Take 1-2 tablets (20-40 mg total) by mouth daily as needed for edema. (Patient not taking: Reported on 07/28/2022) 60 tablet 3   No facility-administered medications prior to visit.    ROS: Review of Systems  Constitutional:  Negative for activity change, appetite change, chills, fatigue and unexpected weight change.  HENT:  Negative for congestion, mouth sores and sinus pressure.   Eyes:  Negative for visual disturbance.  Respiratory:  Negative for cough and chest tightness.   Gastrointestinal:  Negative for abdominal pain and nausea.  Genitourinary:  Negative for difficulty urinating, frequency and vaginal pain.  Musculoskeletal:  Negative for back pain and gait problem.  Skin:  Positive for rash. Negative for pallor.  Neurological:  Negative for dizziness, tremors, weakness, numbness and headaches.  Psychiatric/Behavioral:  Negative for confusion and sleep disturbance.     Objective:  BP 122/78 (BP Location: Right Arm, Patient Position: Sitting, Cuff Size: Large)   Pulse 76   Temp 97.9 F (36.6 C) (Oral)   Ht 5\' 8"  (1.727 m)   Wt 155 lb (70.3 kg)   SpO2 98%   BMI 23.57 kg/m   BP Readings from Last 3 Encounters:  03/24/23 122/78  07/28/22 118/70   08/19/21 112/82    Wt Readings from Last 3 Encounters:  03/24/23 155 lb (70.3 kg)  07/28/22 149 lb (67.6 kg)  08/19/21 156 lb 3.2 oz (70.9 kg)    Physical Exam Constitutional:      General: She is not in acute distress.    Appearance: She is well-developed. She is obese.  HENT:     Head: Normocephalic.     Right Ear: External ear normal.     Left Ear: External ear normal.     Nose: Nose normal.  Eyes:     General:        Right eye: No discharge.        Left eye: No discharge.     Conjunctiva/sclera: Conjunctivae normal.     Pupils: Pupils are equal, round, and reactive to light.  Neck:     Thyroid: No thyromegaly.     Vascular: No JVD.     Trachea: No tracheal deviation.  Cardiovascular:     Rate and Rhythm: Normal rate and regular rhythm.     Heart sounds: Normal heart sounds.  Pulmonary:     Effort: No respiratory distress.     Breath sounds: No stridor. No wheezing.  Abdominal:     General: Bowel sounds are normal. There is no distension.     Palpations: Abdomen is soft. There is no mass.     Tenderness: There is no abdominal tenderness. There is no  guarding or rebound.  Musculoskeletal:        General: No tenderness.     Cervical back: Normal range of motion and neck supple. No rigidity.  Lymphadenopathy:     Cervical: No cervical adenopathy.  Skin:    Findings: No erythema or rash.  Neurological:     Cranial Nerves: No cranial nerve deficit.     Motor: No abnormal muscle tone.     Coordination: Coordination normal.     Deep Tendon Reflexes: Reflexes normal.  Psychiatric:        Behavior: Behavior normal.        Thought Content: Thought content normal.        Judgment: Judgment normal.     Lab Results  Component Value Date   WBC 6.3 08/19/2021   HGB 14.6 08/19/2021   HCT 42.2 08/19/2021   PLT 221.0 08/19/2021   GLUCOSE 95 08/19/2021   CHOL 205 (H) 08/19/2021   TRIG 113.0 08/19/2021   HDL 71.70 08/19/2021   LDLDIRECT 113.0 03/13/2013   LDLCALC  111 (H) 08/19/2021   ALT 14 08/19/2021   AST 17 08/19/2021   NA 138 08/19/2021   K 4.4 08/19/2021   CL 101 08/19/2021   CREATININE 1.03 08/19/2021   BUN 18 08/19/2021   CO2 25 08/19/2021   TSH 1.17 08/19/2021   INR 1.03 05/30/2016    CT Abdomen Pelvis W Contrast  Result Date: 04/09/2020 CLINICAL DATA:  Abdominal abscess, infection suspected abdominal pain, bloating, rectal bleeding EXAM: CT ABDOMEN AND PELVIS WITH CONTRAST TECHNIQUE: Multidetector CT imaging of the abdomen and pelvis was performed using the standard protocol following bolus administration of intravenous contrast. CONTRAST:  124mL OMNIPAQUE IOHEXOL 300 MG/ML  SOLN COMPARISON:  None. FINDINGS: Lower chest: 8 mm calcified right lower lobe pulmonary nodule likely reflecting sequela prior granulomatous disease. Hepatobiliary: No focal liver abnormality is seen. No gallstones, gallbladder wall thickening, or biliary dilatation. Pancreas: Unremarkable. No pancreatic ductal dilatation or surrounding inflammatory changes. Spleen: Normal in size without focal abnormality. Adrenals/Urinary Tract: Normal adrenal glands. 2.5 cm hypodense, fluid attenuating left renal mass consistent with a cyst. 10 mm hypodense, fluid attenuating right renal mass consistent with a cyst. No urolithisasis or obstructive uropathy. Diverticulosis without evidence of diverticulitis. Stomach/Bowel: Stomach is within normal limits. No evidence of bowel wall thickening, distention, or inflammatory changes. Small hiatal hernia. Vascular/Lymphatic: Normal caliber abdominal aorta with mild atherosclerosis. No lymphadenopathy. Reproductive: Status post hysterectomy. No adnexal masses. Other: No abdominal wall hernia or abnormality. No abdominopelvic ascites. Musculoskeletal: No acute osseous abnormality. No aggressive osseous lesion. Bilateral facet arthropathy throughout the lumbar spine. IMPRESSION: 1. No acute abdominal or pelvic pathology. 2. Diverticulosis without  evidence of diverticulitis. 3. Aortic Atherosclerosis (ICD10-I70.0). Electronically Signed   By: Kathreen Devoid   On: 04/09/2020 15:20    Assessment & Plan:   Problem List Items Addressed This Visit       Genitourinary   Overactive bladder   Relevant Orders   Urinalysis   CBC with Differential/Platelet   Comprehensive metabolic panel     Other   Dyslipidemia   Relevant Orders   TSH   CBC with Differential/Platelet   Lipid panel   Well adult exam - Primary     We discussed age appropriate health related issues, including available/recomended screening tests and vaccinations. Labs were ordered to be later reviewed . All questions were answered. We discussed one or more of the following - seat belt use, use of sunscreen/sun exposure exercise, fall  risk reduction, second hand smoke exposure, firearm use and storage, seat belt use, a need for adhering to healthy diet and exercise. Labs were ordered.  All questions were answered. DEXA - pt declined       Relevant Orders   TSH   Urinalysis   CBC with Differential/Platelet   Lipid panel   Comprehensive metabolic panel      No orders of the defined types were placed in this encounter.     Follow-up: Return in about 6 months (around 09/24/2023) for a follow-up visit.  Walker Kehr, MD

## 2023-03-24 NOTE — Patient Instructions (Signed)
Vit D3+K2

## 2023-03-24 NOTE — Assessment & Plan Note (Addendum)

## 2023-09-13 ENCOUNTER — Telehealth: Payer: Medicare HMO | Admitting: Family Medicine

## 2023-09-14 ENCOUNTER — Encounter: Payer: Self-pay | Admitting: Internal Medicine

## 2023-09-14 ENCOUNTER — Ambulatory Visit (INDEPENDENT_AMBULATORY_CARE_PROVIDER_SITE_OTHER): Payer: Medicare HMO | Admitting: Internal Medicine

## 2023-09-14 ENCOUNTER — Telehealth: Payer: Self-pay | Admitting: Internal Medicine

## 2023-09-14 ENCOUNTER — Ambulatory Visit (INDEPENDENT_AMBULATORY_CARE_PROVIDER_SITE_OTHER): Payer: Medicare HMO

## 2023-09-14 VITALS — BP 100/80 | HR 75 | Temp 98.0°F | Ht 68.0 in | Wt 152.0 lb

## 2023-09-14 DIAGNOSIS — M79671 Pain in right foot: Secondary | ICD-10-CM

## 2023-09-14 DIAGNOSIS — M2011 Hallux valgus (acquired), right foot: Secondary | ICD-10-CM | POA: Diagnosis not present

## 2023-09-14 DIAGNOSIS — R1084 Generalized abdominal pain: Secondary | ICD-10-CM

## 2023-09-14 DIAGNOSIS — M2041 Other hammer toe(s) (acquired), right foot: Secondary | ICD-10-CM | POA: Diagnosis not present

## 2023-09-14 NOTE — Telephone Encounter (Signed)
Patient was seen by Dr. Okey Dupre today and forgot to ask about any medication for her foot pain. She had imaging done, but wasn't sure how long the results would take.  Patient would like a call back at [phone].

## 2023-09-14 NOTE — Progress Notes (Unsigned)
Subjective:   Patient ID: Stacy Robinson, female    DOB: [birth year], 77 y.o.   MRN: [id number]  HPI The patient is a 78 YO female coming  Review of Systems  Objective:  Physical Exam  Vitals:   09/14/23 1022  BP: 100/80  Pulse: 75  Temp: 98 F (36.7 C)  TempSrc: Oral  SpO2: 97%  Weight: 152 lb (68.9 kg)  Height: 5\' 8"  (1.727 m)    Assessment & Plan:

## 2023-09-14 NOTE — Patient Instructions (Signed)
We will check an x-ray of the foot to check for any fractures.

## 2023-09-14 NOTE — Telephone Encounter (Signed)
She stated at visit that pain was not really present today so I would not prescribe medication without current pain.

## 2023-09-15 DIAGNOSIS — M79671 Pain in right foot: Secondary | ICD-10-CM | POA: Insufficient documentation

## 2023-09-15 NOTE — Telephone Encounter (Signed)
Spoke with patient and she has expressed to me that she doesn't understand why cannot receive medication for her pain in her foot. I explained to patient that Dr Okey Dupre is out of office right now and was not able to review your concerns nor address and advised the patient that when she returns in the morning she will be able to review it ad I will call her back when I'm done with clinics to advise for her as in to what the provider has advised for her.

## 2023-09-15 NOTE — Telephone Encounter (Signed)
Pt states that she explained that her pain is acute meaning when it comes the pain stays and very painful and it prevents her from driving

## 2023-09-15 NOTE — Telephone Encounter (Signed)
Patient would like clarification about what Dr. Okey Dupre said. She is very upset and feels that she is being mistreated. She would like a call back at [phone].

## 2023-09-15 NOTE — Assessment & Plan Note (Signed)
Associated with chronic constipation. She has had success in the past with colace. Recommended she start with 100 mg BID and increase to 200 mg BID if needed. We discussed fiber and water and exercise to help as well.

## 2023-09-15 NOTE — Assessment & Plan Note (Signed)
Not prominent today. Will check x-ray to assess for cause long term. Treat as appropriate.

## 2023-09-16 NOTE — Telephone Encounter (Signed)
Called pt and LVM

## 2023-09-16 NOTE — Telephone Encounter (Signed)
In the meantime until x-ray back best advice is lidocaine patch over the counter and tylenol 1000 mg up to TID if she has pain.

## 2023-09-22 DIAGNOSIS — H26491 Other secondary cataract, right eye: Secondary | ICD-10-CM | POA: Diagnosis not present

## 2023-10-08 DIAGNOSIS — H26491 Other secondary cataract, right eye: Secondary | ICD-10-CM | POA: Diagnosis not present

## 2023-10-08 DIAGNOSIS — Z961 Presence of intraocular lens: Secondary | ICD-10-CM | POA: Diagnosis not present

## 2023-10-11 ENCOUNTER — Ambulatory Visit: Payer: Medicare HMO | Admitting: Internal Medicine

## 2023-10-13 DIAGNOSIS — M2041 Other hammer toe(s) (acquired), right foot: Secondary | ICD-10-CM | POA: Diagnosis not present

## 2023-10-13 DIAGNOSIS — M79671 Pain in right foot: Secondary | ICD-10-CM | POA: Diagnosis not present

## 2023-10-13 DIAGNOSIS — G5761 Lesion of plantar nerve, right lower limb: Secondary | ICD-10-CM | POA: Diagnosis not present

## 2023-10-13 DIAGNOSIS — M7751 Other enthesopathy of right foot: Secondary | ICD-10-CM | POA: Diagnosis not present

## 2023-10-20 DIAGNOSIS — M7751 Other enthesopathy of right foot: Secondary | ICD-10-CM | POA: Diagnosis not present

## 2023-10-20 DIAGNOSIS — G5761 Lesion of plantar nerve, right lower limb: Secondary | ICD-10-CM | POA: Diagnosis not present

## 2023-10-22 DIAGNOSIS — H26491 Other secondary cataract, right eye: Secondary | ICD-10-CM | POA: Diagnosis not present

## 2023-11-03 DIAGNOSIS — M79671 Pain in right foot: Secondary | ICD-10-CM | POA: Diagnosis not present

## 2023-11-03 DIAGNOSIS — M79675 Pain in left toe(s): Secondary | ICD-10-CM | POA: Diagnosis not present

## 2023-11-03 DIAGNOSIS — L600 Ingrowing nail: Secondary | ICD-10-CM | POA: Diagnosis not present

## 2023-11-03 DIAGNOSIS — B351 Tinea unguium: Secondary | ICD-10-CM | POA: Diagnosis not present

## 2023-11-18 DIAGNOSIS — R1110 Vomiting, unspecified: Secondary | ICD-10-CM | POA: Diagnosis not present

## 2023-11-18 DIAGNOSIS — R1084 Generalized abdominal pain: Secondary | ICD-10-CM | POA: Diagnosis not present

## 2023-12-01 DIAGNOSIS — M79675 Pain in left toe(s): Secondary | ICD-10-CM | POA: Diagnosis not present

## 2023-12-01 DIAGNOSIS — B351 Tinea unguium: Secondary | ICD-10-CM | POA: Diagnosis not present

## 2023-12-01 DIAGNOSIS — M79674 Pain in right toe(s): Secondary | ICD-10-CM | POA: Diagnosis not present

## 2023-12-01 DIAGNOSIS — L600 Ingrowing nail: Secondary | ICD-10-CM | POA: Diagnosis not present

## 2024-01-04 DIAGNOSIS — M79675 Pain in left toe(s): Secondary | ICD-10-CM | POA: Diagnosis not present

## 2024-01-04 DIAGNOSIS — M79674 Pain in right toe(s): Secondary | ICD-10-CM | POA: Diagnosis not present

## 2024-01-04 DIAGNOSIS — B351 Tinea unguium: Secondary | ICD-10-CM | POA: Diagnosis not present

## 2024-01-04 DIAGNOSIS — L600 Ingrowing nail: Secondary | ICD-10-CM | POA: Diagnosis not present

## 2024-02-01 DIAGNOSIS — L600 Ingrowing nail: Secondary | ICD-10-CM | POA: Diagnosis not present

## 2024-02-01 DIAGNOSIS — M79675 Pain in left toe(s): Secondary | ICD-10-CM | POA: Diagnosis not present

## 2024-02-01 DIAGNOSIS — B351 Tinea unguium: Secondary | ICD-10-CM | POA: Diagnosis not present

## 2024-02-01 DIAGNOSIS — M79674 Pain in right toe(s): Secondary | ICD-10-CM | POA: Diagnosis not present

## 2024-02-29 DIAGNOSIS — B351 Tinea unguium: Secondary | ICD-10-CM | POA: Diagnosis not present

## 2024-02-29 DIAGNOSIS — M79674 Pain in right toe(s): Secondary | ICD-10-CM | POA: Diagnosis not present

## 2024-02-29 DIAGNOSIS — L600 Ingrowing nail: Secondary | ICD-10-CM | POA: Diagnosis not present

## 2024-02-29 DIAGNOSIS — M79675 Pain in left toe(s): Secondary | ICD-10-CM | POA: Diagnosis not present

## 2024-03-30 DIAGNOSIS — I83893 Varicose veins of bilateral lower extremities with other complications: Secondary | ICD-10-CM | POA: Diagnosis not present

## 2024-03-30 DIAGNOSIS — M79661 Pain in right lower leg: Secondary | ICD-10-CM | POA: Diagnosis not present

## 2024-03-30 DIAGNOSIS — M79604 Pain in right leg: Secondary | ICD-10-CM | POA: Diagnosis not present

## 2024-03-30 DIAGNOSIS — M79662 Pain in left lower leg: Secondary | ICD-10-CM | POA: Diagnosis not present

## 2024-03-30 DIAGNOSIS — I83891 Varicose veins of right lower extremities with other complications: Secondary | ICD-10-CM | POA: Diagnosis not present

## 2024-04-10 DIAGNOSIS — I872 Venous insufficiency (chronic) (peripheral): Secondary | ICD-10-CM | POA: Diagnosis not present

## 2024-04-12 DIAGNOSIS — I83891 Varicose veins of right lower extremities with other complications: Secondary | ICD-10-CM | POA: Diagnosis not present

## 2024-04-12 DIAGNOSIS — I83811 Varicose veins of right lower extremities with pain: Secondary | ICD-10-CM | POA: Diagnosis not present

## 2024-04-12 DIAGNOSIS — Z09 Encounter for follow-up examination after completed treatment for conditions other than malignant neoplasm: Secondary | ICD-10-CM | POA: Diagnosis not present

## 2024-04-17 DIAGNOSIS — M7989 Other specified soft tissue disorders: Secondary | ICD-10-CM | POA: Diagnosis not present

## 2024-04-17 DIAGNOSIS — I83811 Varicose veins of right lower extremities with pain: Secondary | ICD-10-CM | POA: Diagnosis not present

## 2024-04-17 DIAGNOSIS — I83891 Varicose veins of right lower extremities with other complications: Secondary | ICD-10-CM | POA: Diagnosis not present

## 2024-05-01 DIAGNOSIS — I83891 Varicose veins of right lower extremities with other complications: Secondary | ICD-10-CM | POA: Diagnosis not present

## 2024-05-01 DIAGNOSIS — M7989 Other specified soft tissue disorders: Secondary | ICD-10-CM | POA: Diagnosis not present

## 2024-05-01 DIAGNOSIS — I83811 Varicose veins of right lower extremities with pain: Secondary | ICD-10-CM | POA: Diagnosis not present

## 2024-05-30 ENCOUNTER — Ambulatory Visit: Admitting: Podiatry

## 2024-05-31 ENCOUNTER — Ambulatory Visit: Admitting: Podiatry

## 2024-07-07 ENCOUNTER — Ambulatory Visit

## 2024-07-12 ENCOUNTER — Ambulatory Visit (INDEPENDENT_AMBULATORY_CARE_PROVIDER_SITE_OTHER)

## 2024-07-12 VITALS — Ht 68.0 in | Wt 152.0 lb

## 2024-07-12 DIAGNOSIS — Z0000 Encounter for general adult medical examination without abnormal findings: Secondary | ICD-10-CM | POA: Diagnosis not present

## 2024-07-12 NOTE — Progress Notes (Cosign Needed Addendum)
 Subjective:   Stacy Robinson is a 78 y.o. who presents for a Medicare Wellness preventive visit.  As a reminder, Annual Wellness Visits don't include a physical exam, and some assessments may be limited, especially if this visit is performed virtually. We may recommend an in-person follow-up visit with your provider if needed.  Visit Complete: Virtual I connected with  Stacy Robinson on 07/12/24 by a audio enabled telemedicine application and verified that I am speaking with the correct person using two identifiers.  Patient Location: Home  Provider Location: Home Office  I discussed the limitations of evaluation and management by telemedicine. The patient expressed understanding and agreed to proceed.  Vital Signs: Because this visit was a virtual/telehealth visit, some criteria may be missing or patient reported. Any vitals not documented were not able to be obtained and vitals that have been documented are patient reported.  VideoDeclined- This patient declined Librarian, academic. Therefore the visit was completed with audio only.  Persons Participating in Visit: Patient.  AWV Questionnaire: No: Patient Medicare AWV questionnaire was not completed prior to this visit.  Cardiac Risk Factors include: advanced age (>35men, >75 women);dyslipidemia;Other (see comment), Risk factor comments: Atherosclerosis aorta     Objective:    Today's Vitals   07/12/24 1112  Weight: 152 lb (68.9 kg)  Height: 5' 8 (1.727 m)   Body mass index is 23.11 kg/m.     07/12/2024    1:09 PM 07/28/2022   10:24 AM  Advanced Directives  Does Patient Have a Medical Advance Directive? Yes Yes  Type of Estate agent of [place];Living will Living will;Healthcare Power of Attorney  Does patient want to make changes to medical advance directive?  No - Patient declined  Copy of Healthcare Power of Attorney in Chart? No - copy requested No - copy requested     Current Medications (verified) Outpatient Encounter Medications as of 07/12/2024  Medication Sig   b complex vitamins tablet Take 1 tablet by mouth daily.   Cholecalciferol (VITAMIN D3) 50 MCG (2000 UT) capsule Take 1 capsule (2,000 Units total) by mouth daily.   ELDERBERRY PO Take 1 capsule by mouth daily.   levocetirizine (XYZAL ) 5 MG tablet Take 1 tablet (5 mg total) by mouth every evening.   ALPRAZolam  (XANAX ) 0.25 MG tablet TAKE 1-2 TABLETS TWICE DAILY AS NEEDED FOR ANXIETY OR SLEEP. (Patient not taking: Reported on 07/12/2024)   furosemide  (LASIX ) 20 MG tablet Take 1-2 tablets (20-40 mg total) by mouth daily as needed for edema. (Patient not taking: Reported on 07/12/2024)   No facility-administered encounter medications on file as of 07/12/2024.    Allergies (verified) Metronidazole, Vigamox [moxifloxacin], Ciprofloxacin, Hctz [hydrochlorothiazide ], Sodium sulfate, and Sulfamethoxazole   History: History reviewed. No pertinent past medical history. Past Surgical History:  Procedure Laterality Date   ABDOMINAL HYSTERECTOMY     Family History  Problem Relation Age of Onset   Heart disease Mother    Heart disease Maternal Grandmother    Social History   Socioeconomic History   Marital status: Widowed    Spouse name: Not on file   Number of children: Not on file   Years of education: Not on file   Highest education level: Not on file  Occupational History   Occupation: retired  Tobacco Use   Smoking status: Former   Smokeless tobacco: Never  Vaping Use   Vaping status: Never Used  Substance and Sexual Activity   Alcohol use: Yes  Alcohol/week: 0.0 standard drinks of alcohol   Drug use: No   Sexual activity: Yes  Other Topics Concern   Not on file  Social History Narrative   Lives alone   Social Drivers of Health   Financial Resource Strain: Low Risk  (07/12/2024)   Overall Financial Resource Strain (CARDIA)    Difficulty of Paying Living Expenses: Not  very hard  Food Insecurity: No Food Insecurity (07/12/2024)   Hunger Vital Sign    Worried About Running Out of Food in the Last Year: Never true    Ran Out of Food in the Last Year: Never true  Transportation Needs: No Transportation Needs (07/12/2024)   PRAPARE - Administrator, Civil Service (Medical): No    Lack of Transportation (Non-Medical): No  Physical Activity: Sufficiently Active (07/12/2024)   Exercise Vital Sign    Days of Exercise per Week: 7 days    Minutes of Exercise per Session: 60 min  Stress: No Stress Concern Present (07/12/2024)   [employer] of Occupational Health - Occupational Stress Questionnaire    Feeling of Stress: Not at all  Social Connections: Socially Isolated (07/12/2024)   Social Connection and Isolation Panel    Frequency of Communication with Friends and Family: More than three times a week    Frequency of Social Gatherings with Friends and Family: Three times a week    Attends Religious Services: Never    Active Member of Clubs or Organizations: No    Attends Banker Meetings: Never    Marital Status: Widowed    Tobacco Counseling Counseling given: Not Answered    Clinical Intake:  Pre-visit preparation completed: Yes  Pain : No/denies pain     BMI - recorded: 23.11 Nutritional Status: BMI of 19-24  Normal Nutritional Risks: None Diabetes: No  No results found for: HGBA1C   How often do you need to have someone help you when you read instructions, pamphlets, or other written materials from your doctor or pharmacy?: 1 - Never  Interpreter Needed?: No  Information entered by :: Lashia Niese, RMA   Activities of Daily Living     07/12/2024   11:12 AM  In your present state of health, do you have any difficulty performing the following activities:  Hearing? 0  Vision? 0  Difficulty concentrating or making decisions? 0  Walking or climbing stairs? 0  Dressing or bathing? 0  Doing errands,  shopping? 0  Preparing Food and eating ? N  Using the Toilet? N  In the past six months, have you accidently leaked urine? N  Do you have problems with loss of bowel control? N  Managing your Medications? N  Managing your Finances? N  Housekeeping or managing your Housekeeping? N    Patient Care Team: Plotnikov, Karlynn GAILS, MD as PCP - General Dingeldein, Elspeth, MD (Ophthalmology) Alvia Dorothyann LABOR, MD as Consulting Physician (Obstetrics and Gynecology) Charmayne Molly, MD as Consulting Physician (Ophthalmology)  I have updated your Care Teams any recent Medical Services you may have received from other providers in the past year.     Assessment:   This is a routine wellness examination for Shawny.  Hearing/Vision screen Hearing Screening - Comments:: Denies hearing difficulties   Vision Screening - Comments:: Denies vision issues./ Dr. Charmayne    Goals Addressed             This Visit's Progress    To keep breathing.   On track  Depression Screen     07/12/2024    1:12 PM 03/24/2023    1:59 PM 07/28/2022    9:48 AM 02/18/2021   11:32 AM 09/07/2017    9:15 AM  PHQ 2/9 Scores  PHQ - 2 Score 0 0 0 0 0  PHQ- 9 Score 0        Fall Risk     07/12/2024    1:09 PM 09/14/2023   10:25 AM 03/24/2023    1:59 PM 07/28/2022    9:48 AM 02/18/2021   11:32 AM  Fall Risk   Falls in the past year? 0 0 0 0 0  Number falls in past yr: 0 0 0 0 0  Injury with Fall? 0 0 0 0 0  Risk for fall due to :   No Fall Risks No Fall Risks No Fall Risks  Follow up Falls evaluation completed;Falls prevention discussed Falls evaluation completed Falls evaluation completed Falls evaluation completed       Data saved with a previous flowsheet row definition    MEDICARE RISK AT HOME:  Medicare Risk at Home Any stairs in or around the home?: Yes (2 steps going out) If so, are there any without handrails?: No Home free of loose throw rugs in walkways, pet beds, electrical cords, etc?:  Yes Adequate lighting in your home to reduce risk of falls?: Yes Life alert?: No Use of a cane, walker or w/c?: No Grab bars in the bathroom?: Yes Shower chair or bench in shower?: Yes Elevated toilet seat or a handicapped toilet?: No  TIMED UP AND GO:  Was the test performed?  No  Cognitive Function: Declined/Normal: No cognitive concerns noted by patient or family. Patient alert, oriented, able to answer questions appropriately and recall recent events. No signs of memory loss or confusion.        07/28/2022   10:26 AM  6CIT Screen  What Year? 0 points  What month? 0 points  What time? 0 points  Count back from 20 0 points  Months in reverse 0 points  Repeat phrase 0 points  Total Score 0 points    Immunizations Immunization History  Administered Date(s) Administered   Influenza-Unspecified 10/28/2018   Td 12/28/2009    Screening Tests Health Maintenance  Topic Date Due   Pneumococcal Vaccine: 50+ Years (1 of 1 - PCV) Never done   DEXA SCAN  Never done   DTaP/Tdap/Td (2 - Tdap) 12/29/2019   Medicare Annual Wellness (AWV)  07/29/2023   INFLUENZA VACCINE  07/28/2024   Hepatitis B Vaccines  Aged Out   HPV VACCINES  Aged Out   Meningococcal B Vaccine  Aged Out   Colonoscopy  Discontinued   COVID-19 Vaccine  Discontinued   Zoster Vaccines- Shingrix  Discontinued    Health Maintenance  Health Maintenance Due  Topic Date Due   Pneumococcal Vaccine: 50+ Years (1 of 1 - PCV) Never done   DEXA SCAN  Never done   DTaP/Tdap/Td (2 - Tdap) 12/29/2019   Medicare Annual Wellness (AWV)  07/29/2023   Health Maintenance Items Addressed: See Nurse Notes at the end of this note  Additional Screening:  Vision Screening: Recommended annual ophthalmology exams for early detection of glaucoma and other disorders of the eye. Would you like a referral to an eye doctor? No    Dental Screening: Recommended annual dental exams for proper oral hygiene  Community Resource  Referral / Chronic Care Management: CRR required this visit?  No  CCM required this visit?  No   Plan:    I have personally reviewed and noted the following in the patient's chart:   Medical and social history Use of alcohol, tobacco or illicit drugs  Current medications and supplements including opioid prescriptions. Patient is not currently taking opioid prescriptions. Functional ability and status Nutritional status Physical activity Advanced directives List of other physicians Hospitalizations, surgeries, and ER visits in previous 12 months Vitals Screenings to include cognitive, depression, and falls Referrals and appointments  In addition, I have reviewed and discussed with patient certain preventive protocols, quality metrics, and best practice recommendations. A written personalized care plan for preventive services as well as general preventive health recommendations were provided to patient.   Alder Murri L Eliani Leclere, CMA   07/12/2024   After Visit Summary: (Mail) Due to this being a telephonic visit, the after visit summary with patients personalized plan was offered to patient via mail   Notes: Nothing significant to report at this time.   Medical screening examination/treatment/procedure(s) were performed by non-physician practitioner and as supervising physician I was immediately available for consultation/collaboration.  I agree with above. Karlynn Noel, MD

## 2024-07-12 NOTE — Patient Instructions (Signed)
 Stacy Robinson , Thank you for taking time out of your busy schedule to complete your Annual Wellness Visit with me. I enjoyed our conversation and look forward to speaking with you again next year. I, as well as your care team,  appreciate your ongoing commitment to your health goals. Please review the following plan we discussed and let me know if I can assist you in the future. Your Game plan/ To Do List   Follow up Visits: Next Medicare AWV with our clinical staff: Prefers a telephone visit.    07/13/2025. Have you seen your provider in the last 6 months (3 months if uncontrolled diabetes)? No Next Office Visit with your provider: Patient stated that she will call when she is in need.   Clinician Recommendations:  Aim for 30 minutes of exercise or brisk walking, 6-8 glasses of water, and 5 servings of fruits and vegetables each day. Keep up the good work.      This is a list of the screening recommended for you and due dates:  Health Maintenance  Topic Date Due   Pneumococcal Vaccine for age over 63 (1 of 1 - PCV) Never done   DEXA scan (bone density measurement)  Never done   DTaP/Tdap/Td vaccine (2 - Tdap) 12/29/2019   Medicare Annual Wellness Visit  07/29/2023   Flu Shot  07/28/2024   Hepatitis B Vaccine  Aged Out   HPV Vaccine  Aged Out   Meningitis B Vaccine  Aged Out   Colon Cancer Screening  Discontinued   COVID-19 Vaccine  Discontinued   Zoster (Shingles) Vaccine  Discontinued    Advanced directives: (Copy Requested) Please bring a copy of your health care power of attorney and living will to the office to be added to your chart at your convenience. You can mail to [facility] [address] [place], [place] [address] or email to [email or web address] Advance Care Planning is important because it:  [x]  Makes sure you receive the medical care that is consistent with your values, goals, and preferences  [x]  It provides guidance to your family and loved  ones and reduces their decisional burden about whether or not they are making the right decisions based on your wishes.  Follow the link provided in your after visit summary or read over the paperwork we have mailed to you to help you started getting your Advance Directives in place. If you need assistance in completing these, please reach out to us  so that we can help you!  See attachments for Preventive Care and Fall Prevention Tips.

## 2024-07-14 ENCOUNTER — Telehealth: Payer: Self-pay

## 2024-07-14 NOTE — Telephone Encounter (Signed)
 Copied from CRM [id number]. Topic: General - Other >> Jul 14, 2024 11:53 AM Burnard DEL wrote: Reason for CRM: Patient is requesting a phone call from [facility]. Patient wanted to give diagnosis to her that she inquired about at her AWV telephone call.

## 2024-07-14 NOTE — Telephone Encounter (Signed)
 Called back to give a Dx for her foot.  It was Cristopher Buttner

## 2024-07-19 ENCOUNTER — Ambulatory Visit: Admitting: Internal Medicine

## 2024-07-19 ENCOUNTER — Ambulatory Visit (INDEPENDENT_AMBULATORY_CARE_PROVIDER_SITE_OTHER): Admitting: Internal Medicine

## 2024-07-19 ENCOUNTER — Encounter: Payer: Self-pay | Admitting: Internal Medicine

## 2024-07-19 VITALS — BP 113/73 | HR 84 | Temp 97.8°F | Ht 68.0 in | Wt 153.0 lb

## 2024-07-19 DIAGNOSIS — J4520 Mild intermittent asthma, uncomplicated: Secondary | ICD-10-CM

## 2024-07-19 DIAGNOSIS — G4700 Insomnia, unspecified: Secondary | ICD-10-CM

## 2024-07-19 DIAGNOSIS — Z0000 Encounter for general adult medical examination without abnormal findings: Secondary | ICD-10-CM | POA: Diagnosis not present

## 2024-07-19 DIAGNOSIS — R944 Abnormal results of kidney function studies: Secondary | ICD-10-CM | POA: Diagnosis not present

## 2024-07-19 LAB — TSH: TSH: 0.63 u[IU]/mL (ref 0.35–5.50)

## 2024-07-19 LAB — CBC WITH DIFFERENTIAL/PLATELET
Basophils Absolute: 0.1 K/uL (ref 0.0–0.1)
Basophils Relative: 0.7 % (ref 0.0–3.0)
Eosinophils Absolute: 0 K/uL (ref 0.0–0.7)
Eosinophils Relative: 0.5 % (ref 0.0–5.0)
HCT: 43.5 % (ref 36.0–46.0)
Hemoglobin: 14.7 g/dL (ref 12.0–15.0)
Lymphocytes Relative: 36.8 % (ref 12.0–46.0)
Lymphs Abs: 2.6 K/uL (ref 0.7–4.0)
MCHC: 33.8 g/dL (ref 30.0–36.0)
MCV: 93.1 fl (ref 78.0–100.0)
Monocytes Absolute: 0.6 K/uL (ref 0.1–1.0)
Monocytes Relative: 9.3 % (ref 3.0–12.0)
Neutro Abs: 3.7 K/uL (ref 1.4–7.7)
Neutrophils Relative %: 52.7 % (ref 43.0–77.0)
Platelets: 201 K/uL (ref 150.0–400.0)
RBC: 4.67 Mil/uL (ref 3.87–5.11)
RDW: 13.6 % (ref 11.5–15.5)
WBC: 7 K/uL (ref 4.0–10.5)

## 2024-07-19 LAB — COMPREHENSIVE METABOLIC PANEL WITH GFR
ALT: 13 U/L (ref 0–35)
AST: 19 U/L (ref 0–37)
Albumin: 4.4 g/dL (ref 3.5–5.2)
Alkaline Phosphatase: 63 U/L (ref 39–117)
BUN: 18 mg/dL (ref 6–23)
CO2: 27 meq/L (ref 19–32)
Calcium: 9.2 mg/dL (ref 8.4–10.5)
Chloride: 104 meq/L (ref 96–112)
Creatinine, Ser: 0.99 mg/dL (ref 0.40–1.20)
GFR: 53.96 mL/min — ABNORMAL LOW (ref >60.00)
Glucose, Bld: 93 mg/dL (ref 70–99)
Potassium: 3.8 meq/L (ref 3.5–5.1)
Sodium: 138 meq/L (ref 135–145)
Total Bilirubin: 0.6 mg/dL (ref 0.2–1.2)
Total Protein: 7.5 g/dL (ref 6.0–8.3)

## 2024-07-19 LAB — LIPID PANEL
Cholesterol: 241 mg/dL — ABNORMAL HIGH (ref 0–200)
HDL: 77.8 mg/dL (ref >39.00)
LDL Cholesterol: 140 mg/dL — ABNORMAL HIGH (ref 0–99)
NonHDL: 163.56
Total CHOL/HDL Ratio: 3
Triglycerides: 119 mg/dL (ref 0.0–149.0)
VLDL: 23.8 mg/dL (ref 0.0–40.0)

## 2024-07-19 MED ORDER — ALBUTEROL SULFATE HFA 108 (90 BASE) MCG/ACT IN AERS: 2.0000 | INHALATION_SPRAY | RESPIRATORY_TRACT | 2 refills | Status: AC | PRN

## 2024-07-19 NOTE — Progress Notes (Unsigned)
 Subjective:  Patient ID: Stacy Robinson, female    DOB: 10-12-1946  Age: 78 y.o. MRN: [id number]  CC: Annual Exam   HPI Stacy Robinson presents for a well exam F/u on allergies, h/o insomnia  Outpatient Medications Prior to Visit  Medication Sig Dispense Refill  . b complex vitamins tablet Take 1 tablet by mouth daily. 100 tablet 3  . Cholecalciferol (VITAMIN D3) 50 MCG (2000 UT) capsule Take 1 capsule (2,000 Units total) by mouth daily. 100 capsule 3  . ELDERBERRY PO Take 1 capsule by mouth daily.    SABRA levocetirizine (XYZAL ) 5 MG tablet Take 1 tablet (5 mg total) by mouth every evening. 100 tablet 3  . terbinafine (LAMISIL) 250 MG tablet Take 250 mg by mouth daily.    . ALPRAZolam  (XANAX ) 0.25 MG tablet TAKE 1-2 TABLETS TWICE DAILY AS NEEDED FOR ANXIETY OR SLEEP. (Patient not taking: Reported on 07/19/2024) 30 tablet 3  . furosemide  (LASIX ) 20 MG tablet Take 1-2 tablets (20-40 mg total) by mouth daily as needed for edema. (Patient not taking: Reported on 07/19/2024) 60 tablet 3   No facility-administered medications prior to visit.    ROS: Review of Systems  Constitutional:  Negative for activity change, appetite change, chills, fatigue and unexpected weight change.  HENT:  Negative for congestion, mouth sores and sinus pressure.   Eyes:  Negative for visual disturbance.  Respiratory:  Negative for cough and chest tightness.   Gastrointestinal:  Negative for abdominal pain and nausea.  Genitourinary:  Negative for difficulty urinating, frequency and vaginal pain.  Musculoskeletal:  Negative for back pain and gait problem.  Skin:  Negative for pallor and rash.  Neurological:  Negative for dizziness, tremors, weakness, numbness and headaches.  Psychiatric/Behavioral:  Negative for confusion and sleep disturbance.     Objective:  BP 113/73   Pulse 84   Temp 97.8 F (36.6 C) (Oral)   Ht 5' 8 (1.727 m)   Wt 153 lb (69.4 kg)   SpO2 97%   BMI 23.26 kg/m   BP Readings from  Last 3 Encounters:  07/19/24 113/73  09/14/23 100/80  03/24/23 122/78    Wt Readings from Last 3 Encounters:  07/19/24 153 lb (69.4 kg)  07/12/24 152 lb (68.9 kg)  09/14/23 152 lb (68.9 kg)    Physical Exam Constitutional:      General: She is not in acute distress.    Appearance: She is well-developed.  HENT:     Head: Normocephalic.     Right Ear: External ear normal.     Left Ear: External ear normal.     Nose: Nose normal.  Eyes:     General:        Right eye: No discharge.        Left eye: No discharge.     Conjunctiva/sclera: Conjunctivae normal.     Pupils: Pupils are equal, round, and reactive to light.  Neck:     Thyroid : No thyromegaly.     Vascular: No JVD.     Trachea: No tracheal deviation.  Cardiovascular:     Rate and Rhythm: Normal rate and regular rhythm.     Heart sounds: Normal heart sounds.  Pulmonary:     Effort: No respiratory distress.     Breath sounds: No stridor. No wheezing.  Abdominal:     General: Bowel sounds are normal. There is no distension.     Palpations: Abdomen is soft. There is no mass.  Tenderness: There is no abdominal tenderness. There is no guarding or rebound.  Musculoskeletal:        General: Tenderness present.     Cervical back: Normal range of motion and neck supple. No rigidity.     Right lower leg: No edema.     Left lower leg: No edema.  Lymphadenopathy:     Cervical: No cervical adenopathy.  Skin:    Findings: No erythema or rash.  Neurological:     Mental Status: She is oriented to person, place, and time.     Cranial Nerves: No cranial nerve deficit.     Motor: No abnormal muscle tone.     Coordination: Coordination normal.     Deep Tendon Reflexes: Reflexes normal.  Psychiatric:        Behavior: Behavior normal.        Thought Content: Thought content normal.        Judgment: Judgment normal.    LS w/pain B surgical shoes   Lab Results  Component Value Date   WBC 6.2 03/24/2023   HGB 14.7  03/24/2023   HCT 42.8 03/24/2023   PLT 210.0 03/24/2023   GLUCOSE 81 03/24/2023   CHOL 221 (H) 03/24/2023   TRIG 124.0 03/24/2023   HDL 85.00 03/24/2023   LDLDIRECT 113.0 03/13/2013   LDLCALC 112 (H) 03/24/2023   ALT 21 03/24/2023   AST 22 03/24/2023   NA 137 03/24/2023   K 4.4 03/24/2023   CL 102 03/24/2023   CREATININE 0.72 03/24/2023   BUN 19 03/24/2023   CO2 28 03/24/2023   TSH 0.68 03/24/2023   INR 1.03 05/30/2016    CT Abdomen Pelvis W Contrast Result Date: 04/09/2020 CLINICAL DATA:  Abdominal abscess, infection suspected abdominal pain, bloating, rectal bleeding EXAM: CT ABDOMEN AND PELVIS WITH CONTRAST TECHNIQUE: Multidetector CT imaging of the abdomen and pelvis was performed using the standard protocol following bolus administration of intravenous contrast. CONTRAST:  OMNIPAQUE  IOHEXOL  300 MG/ML  SOLN COMPARISON:  None. FINDINGS: Lower chest: 8 mm calcified right lower lobe pulmonary nodule likely reflecting sequela prior granulomatous disease. Hepatobiliary: No focal liver abnormality is seen. No gallstones, gallbladder wall thickening, or biliary dilatation. Pancreas: Unremarkable. No pancreatic ductal dilatation or surrounding inflammatory changes. Spleen: Normal in size without focal abnormality. Adrenals/Urinary Tract: Normal adrenal glands. 2.5 cm hypodense, fluid attenuating left renal mass consistent with a cyst. 10 mm hypodense, fluid attenuating right renal mass consistent with a cyst. No urolithisasis or obstructive uropathy. Diverticulosis without evidence of diverticulitis. Stomach/Bowel: Stomach is within normal limits. No evidence of bowel wall thickening, distention, or inflammatory changes. Small hiatal hernia. Vascular/Lymphatic: Normal caliber abdominal aorta with mild atherosclerosis. No lymphadenopathy. Reproductive: Status post hysterectomy. No adnexal masses. Other: No abdominal wall hernia or abnormality. No abdominopelvic ascites. Musculoskeletal: No  acute osseous abnormality. No aggressive osseous lesion. Bilateral facet arthropathy throughout the lumbar spine. IMPRESSION: 1. No acute abdominal or pelvic pathology. 2. Diverticulosis without evidence of diverticulitis. 3. Aortic Atherosclerosis (ICD10-I70.0). Electronically Signed   By: Julaine Blanch   On: 04/09/2020 15:20    Assessment & Plan:   Problem List Items Addressed This Visit     Decreased GFR   8/22 GFR 52 Improve hydration      Relevant Orders   TSH   Urinalysis   CBC with Differential/Platelet   Lipid panel   Comprehensive metabolic panel with GFR   Insomnia   Resolved      Well adult exam - Primary  We discussed age appropriate health related issues, including available/recomended screening tests and vaccinations. Labs were ordered to be later reviewed . All questions were answered. We discussed one or more of the following - seat belt use, use of sunscreen/sun exposure exercise, fall risk reduction, second hand smoke exposure, firearm use and storage, seat belt use, a need for adhering to healthy diet and exercise. Labs were ordered.  All questions were answered. DEXA - pt declined Mammo - pt declined Eye exam q 1 year Dental - q 6 months Pt declined Cologuard, vaccines       Relevant Orders   TSH   Urinalysis   CBC with Differential/Platelet   Lipid panel   Comprehensive metabolic panel with GFR      Meds ordered this encounter  Medications  . albuterol  (VENTOLIN  HFA) 108 (90 Base) MCG/ACT inhaler    Sig: Inhale 2 puffs into the lungs every 4 (four) hours as needed for wheezing or shortness of breath.    Dispense:  8 g    Refill:  2      Follow-up: Return in about 1 year (around 07/19/2025) for Wellness Exam.  Marolyn Noel, MD

## 2024-07-19 NOTE — Assessment & Plan Note (Signed)
  We discussed age appropriate health related issues, including available/recomended screening tests and vaccinations. Labs were ordered to be later reviewed . All questions were answered. We discussed one or more of the following - seat belt use, use of sunscreen/sun exposure exercise, fall risk reduction, second hand smoke exposure, firearm use and storage, seat belt use, a need for adhering to healthy diet and exercise. Labs were ordered.  All questions were answered. DEXA - pt declined Mammo - pt declined Eye exam q 1 year Dental - q 6 months Pt declined Cologuard, vaccines

## 2024-07-19 NOTE — Assessment & Plan Note (Signed)
 8/22 GFR 52 Improve hydration

## 2024-07-19 NOTE — Assessment & Plan Note (Signed)
 Resolved

## 2024-07-20 LAB — URINALYSIS, ROUTINE W REFLEX MICROSCOPIC
Leukocytes,Ua: NEGATIVE
Nitrite: NEGATIVE
Specific Gravity, Urine: 1.025 (ref 1.000–1.030)
Total Protein, Urine: NEGATIVE
Urine Glucose: NEGATIVE
Urobilinogen, UA: 0.2 (ref 0.0–1.0)
WBC, UA: NONE SEEN (ref 0–?)
pH: 6 (ref 5.0–8.0)

## 2024-07-20 NOTE — Assessment & Plan Note (Signed)
 Ventolin  MDI 2 puffs 4 times daily as needed

## 2024-07-23 ENCOUNTER — Ambulatory Visit: Payer: Self-pay | Admitting: Internal Medicine

## 2024-07-31 DIAGNOSIS — I872 Venous insufficiency (chronic) (peripheral): Secondary | ICD-10-CM | POA: Diagnosis not present

## 2024-07-31 DIAGNOSIS — R600 Localized edema: Secondary | ICD-10-CM | POA: Diagnosis not present

## 2024-07-31 DIAGNOSIS — S92351A Displaced fracture of fifth metatarsal bone, right foot, initial encounter for closed fracture: Secondary | ICD-10-CM | POA: Diagnosis not present

## 2024-07-31 DIAGNOSIS — I87393 Chronic venous hypertension (idiopathic) with other complications of bilateral lower extremity: Secondary | ICD-10-CM | POA: Diagnosis not present

## 2024-08-23 DIAGNOSIS — M79671 Pain in right foot: Secondary | ICD-10-CM | POA: Diagnosis not present

## 2024-08-23 DIAGNOSIS — S92351A Displaced fracture of fifth metatarsal bone, right foot, initial encounter for closed fracture: Secondary | ICD-10-CM | POA: Diagnosis not present

## 2024-10-11 ENCOUNTER — Telehealth: Payer: Self-pay

## 2024-10-11 NOTE — Telephone Encounter (Signed)
 Copied from CRM #[id number]. Topic: Clinical - Medical Advice >> Oct 11, 2024  1:40 PM Precious C wrote: Reason for CRM: Curtistine from [facility] called requesting a bone density test fro pt, states pt needs one every 2 year. Requesting a call to confirm.  Callback: [phone]

## 2024-10-13 NOTE — Telephone Encounter (Signed)
 Okay to order DEXA scan if the patient is willing to have it.  Thank you

## 2024-12-12 NOTE — Telephone Encounter (Signed)
 Curtistine from [place] [facility] called to follow up on this, but the order has not been placed. Dr. Garald approved the order on 10/13/24, but no order was placed. Please advise.

## 2024-12-14 NOTE — Telephone Encounter (Signed)
 Okay.  Thanks.

## 2024-12-14 NOTE — Addendum Note (Signed)
 Addended by: Teylor Wolven V on: 12/14/2024 10:31 PM   Modules accepted: Orders

## 2024-12-14 NOTE — Telephone Encounter (Signed)
 Please place an order per the front desk for pts Dexa scan.

## 2025-01-19 ENCOUNTER — Ambulatory Visit: Payer: Self-pay

## 2025-01-19 NOTE — Telephone Encounter (Signed)
 FYI Only or Action Required?: Action required by provider: clinical question for provider and request for documentation or forms.  Patient was last seen in primary care on 07/19/2024 by Plotnikov, Karlynn GAILS, MD.  Called Nurse Triage reporting Form Completion.   Triage Disposition: Call PCP When Office is Open  Patient/caregiver understands and will follow disposition?: Yes    Copied from CRM #[id number]. Topic: Clinical - Medical Advice >> Jan 19, 2025  4:31 PM Avram MATSU wrote: Curtistine called from [facility] is calling follow up with patient bone density order. The request was sent several months ago and would like to know that status. Patient needs to be schedule soon as possible. Cal was not able to provided formation at the time. Please advise [phone]   Reason for Disposition  [1] Caller requesting NON-URGENT health information AND [2] PCP's office is the best resource  Answer Assessment - Initial Assessment Questions 1. REASON FOR CALL: What is the main reason for your call? or How can I best help you?     [employer] request for bone density order from PCP. Per records, bone density test has been ordered but not scheduled at this time.  Protocols used: Information Only Call - No Triage-A-AH

## 2025-07-13 ENCOUNTER — Ambulatory Visit
